# Patient Record
Sex: Female | Born: 1990 | Race: Black or African American | Hispanic: No | Marital: Married | Smoking: Never smoker
Health system: Southern US, Community
[De-identification: ages and names within clinical notes are randomized; demographics above are authoritative.]

## PROBLEM LIST (undated history)

## (undated) DIAGNOSIS — G43909 Migraine, unspecified, not intractable, without status migrainosus: Secondary | ICD-10-CM

## (undated) DIAGNOSIS — D649 Anemia, unspecified: Secondary | ICD-10-CM

## (undated) DIAGNOSIS — J45909 Unspecified asthma, uncomplicated: Secondary | ICD-10-CM

## (undated) DIAGNOSIS — R519 Headache, unspecified: Secondary | ICD-10-CM

## (undated) DIAGNOSIS — R51 Headache: Secondary | ICD-10-CM

## (undated) DIAGNOSIS — R55 Syncope and collapse: Secondary | ICD-10-CM

## (undated) DIAGNOSIS — R0600 Dyspnea, unspecified: Secondary | ICD-10-CM

## (undated) DIAGNOSIS — K8020 Calculus of gallbladder without cholecystitis without obstruction: Secondary | ICD-10-CM

## (undated) DIAGNOSIS — IMO0001 Reserved for inherently not codable concepts without codable children: Secondary | ICD-10-CM

## (undated) HISTORY — DX: Reserved for inherently not codable concepts without codable children: IMO0001

## (undated) HISTORY — DX: Syncope and collapse: R55

## (undated) HISTORY — DX: Headache: R51

## (undated) HISTORY — DX: Migraine, unspecified, not intractable, without status migrainosus: G43.909

## (undated) HISTORY — DX: Headache, unspecified: R51.9

---

## 2010-01-22 ENCOUNTER — Emergency Department (HOSPITAL_COMMUNITY): Admission: EM | Admit: 2010-01-22 | Discharge: 2010-01-22 | Payer: Self-pay | Admitting: Emergency Medicine

## 2011-03-17 LAB — POCT RAPID STREP A (OFFICE): Streptococcus, Group A Screen (Direct): NEGATIVE

## 2013-04-11 ENCOUNTER — Encounter (HOSPITAL_COMMUNITY): Payer: Self-pay | Admitting: Emergency Medicine

## 2013-04-11 ENCOUNTER — Emergency Department (INDEPENDENT_AMBULATORY_CARE_PROVIDER_SITE_OTHER)
Admission: EM | Admit: 2013-04-11 | Discharge: 2013-04-11 | Disposition: A | Discharge status: Home / Self Care | Payer: Medicaid Other | Source: Home / Self Care | Attending: Emergency Medicine | Admitting: Emergency Medicine

## 2013-04-11 DIAGNOSIS — J069 Acute upper respiratory infection, unspecified: Secondary | ICD-10-CM

## 2013-04-11 DIAGNOSIS — H8110 Benign paroxysmal vertigo, unspecified ear: Secondary | ICD-10-CM

## 2013-04-11 LAB — POCT I-STAT, CHEM 8
BUN: 10 mg/dL (ref 6–23)
Chloride: 104 mEq/L (ref 96–112)
Potassium: 3.6 mEq/L (ref 3.5–5.1)
Sodium: 142 mEq/L (ref 135–145)

## 2013-04-11 NOTE — ED Notes (Addendum)
Pt is here c/o dizziness onset 3/31 Sx have included: fevers, nauseas, weakness Dizziness gets worse w/acitivty/walking; when resting it's not as bad Denies: v/d, abd pain, head inj/trauma Reports being sick last week w/a cold  She is alert and oriented w/no signs of acute distress.

## 2013-04-11 NOTE — ED Provider Notes (Signed)
Medical screening examination/treatment/procedure(s) were performed by non-physician practitioner and as supervising physician I was immediately available for consultation/collaboration.  Leslee Home, M.D.  Reuben Likes, MD 04/11/13 [phone]

## 2013-04-11 NOTE — ED Provider Notes (Signed)
History     CSN: [id number]  Arrival date & time 04/11/13  1031   None     Chief Complaint  Patient presents with  . Dizziness    (Consider location/radiation/quality/duration/timing/severity/associated sxs/prior treatment) HPI Comments: Pt reports feeling aches and chills, congestion starting on 4/9.  Chills and aches gone in a day, congestion persists and now has slight cough.  Presents today because began feeling dizzy last night, worse with position changes.  Describes lightheadedness, and also vertigo.   Patient is a 22 y.o. female presenting with URI. The history is provided by the patient.  URI Presenting symptoms: congestion, cough and sore throat   Presenting symptoms: no facial pain and no fever   Severity:  Moderate Onset quality:  Gradual Duration:  5 days Timing:  Constant Progression:  Unchanged Chronicity:  New Relieved by:  None tried Worsened by:  Nothing tried Ineffective treatments:  None tried Associated symptoms: myalgias   Associated symptoms: no sinus pain and no wheezing   Associated symptoms comment:  Dizziness   History reviewed. No pertinent past medical history.  History reviewed. No pertinent past surgical history.  History reviewed. No pertinent family history.  History  Substance Use Topics  . Smoking status: Never Smoker   . Smokeless tobacco: Not on file  . Alcohol Use: No    OB History   Grav Para Term Preterm Abortions TAB SAB Ect Mult Living                  Review of Systems  Constitutional: Positive for chills. Negative for fever.  HENT: Positive for congestion, sore throat and postnasal drip. Negative for sinus pressure.        Ear pressure  Respiratory: Positive for cough. Negative for chest tightness, shortness of breath and wheezing.   Musculoskeletal: Positive for myalgias.  Neurological: Positive for dizziness and light-headedness.    Allergies  Review of patient's allergies indicates no known  allergies.  Home Medications  No current outpatient prescriptions on file.  BP 116/65  Pulse 103  Temp(Src) 98.3 F (36.8 C) (Oral)  Resp 18  SpO2 100%  LMP 04/06/2013  Physical Exam  Constitutional: She is oriented to person, place, and time. She appears well-developed and well-nourished. No distress.  HENT:  Right Ear: Tympanic membrane, external ear and ear canal normal.  Left Ear: Tympanic membrane, external ear and ear canal normal.  Nose: Mucosal edema present. Right sinus exhibits no maxillary sinus tenderness and no frontal sinus tenderness. Left sinus exhibits no maxillary sinus tenderness and no frontal sinus tenderness.  Mouth/Throat: Oropharynx is clear and moist and mucous membranes are normal.  dix-hallpike indicates problem with L side.    Eyes: Conjunctivae and EOM are normal.  Nystagmus with dix hallpike on Left.   Cardiovascular: Normal rate and regular rhythm.   Pulmonary/Chest: Effort normal and breath sounds normal.  Neurological: She is alert and oriented to person, place, and time. Gait normal.    ED Course  Procedures (including critical care time)  Labs Reviewed  POCT I-STAT, CHEM 8 - Abnormal; Notable for the following:    Glucose, Bld 101 (*)    Calcium, Ion 1.24 (*)    All other components within normal limits   No results found.   1. Benign positional vertigo   2. URI (upper respiratory infection)       MDM  Epley maneuver resolved dizziness/vertigo sx.  Pt not orthostatic, not anemic.  Like benign vertigo and uri.  Pt  to try otc decongestant and/or saline spray. Can repeat epley maneuver at home if dizzy sx return.         Cathlyn Parsons, NP 04/11/13 1228  Cathlyn Parsons, NP 04/11/13 1230

## 2014-07-03 ENCOUNTER — Encounter (HOSPITAL_COMMUNITY): Payer: Self-pay | Admitting: Emergency Medicine

## 2014-07-03 ENCOUNTER — Emergency Department (HOSPITAL_COMMUNITY)
Admission: EM | Admit: 2014-07-03 | Discharge: 2014-07-03 | Disposition: A | Discharge status: Home / Self Care | Payer: Medicaid Other | Source: Home / Self Care | Attending: Emergency Medicine | Admitting: Emergency Medicine

## 2014-07-03 DIAGNOSIS — L509 Urticaria, unspecified: Secondary | ICD-10-CM

## 2014-07-03 HISTORY — DX: Unspecified asthma, uncomplicated: J45.909

## 2014-07-03 HISTORY — DX: Anemia, unspecified: D64.9

## 2014-07-03 MED ORDER — PREDNISONE 20 MG PO TABS: ORAL_TABLET | ORAL | Status: DC

## 2014-07-03 MED ORDER — METHYLPREDNISOLONE ACETATE 80 MG/ML IJ SUSP
INTRAMUSCULAR | Status: AC
  Filled 2014-07-03: qty 1

## 2014-07-03 MED ORDER — METHYLPREDNISOLONE ACETATE 80 MG/ML IJ SUSP
80.0000 mg | Freq: Once | INTRAMUSCULAR | Status: AC
  Administered 2014-07-03: 80 mg via INTRAMUSCULAR

## 2014-07-03 MED ORDER — DIPHENHYDRAMINE HCL 50 MG/ML IJ SOLN
25.0000 mg | Freq: Once | INTRAMUSCULAR | Status: AC
  Administered 2014-07-03: 25 mg via INTRAMUSCULAR

## 2014-07-03 MED ORDER — RANITIDINE HCL 150 MG PO TABS: 150.0000 mg | ORAL_TABLET | Freq: Two times a day (BID) | ORAL | Status: DC

## 2014-07-03 MED ORDER — HYDROXYZINE HCL 25 MG PO TABS
25.0000 mg | ORAL_TABLET | Freq: Three times a day (TID) | ORAL | Status: DC
Start: 2014-07-03 — End: 2021-08-20

## 2014-07-03 MED ORDER — DIPHENHYDRAMINE HCL 50 MG/ML IJ SOLN
INTRAMUSCULAR | Status: AC
  Filled 2014-07-03: qty 1

## 2014-07-03 NOTE — ED Notes (Signed)
C/o  Hives that come and go with irritation over the past three weeks.  Denies changes in soaps/detergents and no new medications.

## 2014-07-03 NOTE — Discharge Instructions (Signed)
Hives Hives are itchy, red, swollen areas of the skin. They can vary in size and location on your body. Hives can come and go for hours or several days (acute hives) or for several weeks (chronic hives). Hives do not spread from person to person (noncontagious). They may get worse with scratching, exercise, and emotional stress. CAUSES   Allergic reaction to food, additives, or drugs.  Infections, including the common cold.  Illness, such as vasculitis, lupus, or thyroid disease.  Exposure to sunlight, heat, or cold.  Exercise.  Stress.  Contact with chemicals. SYMPTOMS   Red or white swollen patches on the skin. The patches may change size, shape, and location quickly and repeatedly.  Itching.  Swelling of the hands, feet, and face. This may occur if hives develop deeper in the skin. DIAGNOSIS  Your caregiver can usually tell what is wrong by performing a physical exam. Skin or blood tests may also be done to determine the cause of your hives. In some cases, the cause cannot be determined. TREATMENT  Mild cases usually get better with medicines such as antihistamines. Severe cases may require an emergency epinephrine injection. If the cause of your hives is known, treatment includes avoiding that trigger.  HOME CARE INSTRUCTIONS   Avoid causes that trigger your hives.  Take antihistamines as directed by your caregiver to reduce the severity of your hives. Non-sedating or low-sedating antihistamines are usually recommended. Do not drive while taking an antihistamine.  Take any other medicines prescribed for itching as directed by your caregiver.  Wear loose-fitting clothing.  Keep all follow-up appointments as directed by your caregiver. SEEK MEDICAL CARE IF:   You have persistent or severe itching that is not relieved with medicine.  You have painful or swollen joints. SEEK IMMEDIATE MEDICAL CARE IF:   You have a fever.  Your tongue or lips are swollen.  You have  trouble breathing or swallowing.  You feel tightness in the throat or chest.  You have abdominal pain. These problems may be the first sign of a life-threatening allergic reaction. Call your local emergency services (911 in [place] MAKE SURE YOU:   Understand these instructions.  Will watch your condition.  Will get help right away if you are not doing well or get worse. Document Released: 12/15/2005 Document Revised: 12/20/2013 Document Reviewed: 03/09/2012 Exit[employer] Patient Information 2015 [employer]. This information is not intended to replace advice given to you by your health care provider. Make sure you discuss any questions you have with your health care provider.  

## 2014-07-03 NOTE — ED Provider Notes (Signed)
  Chief Complaint    Chief Complaint  Patient presents with  . Urticaria    History of Present Illness      Denyce RobertShari Hatfield is a 23 year old female who has had intermittent, migratory urticarial rash since June 15. She cannot think of anything that might have precipitated this. Particularly she denies any bites, stings, medications, new foods, or exposure to any contact allergens or antigens. The hives come and go on different parts of her body. They're very itchy. She's had some swelling of her lips. She denies any swelling of the tongue or throat. She's had no difficulty breathing. She has not tried anything for these except for some topical creams. She denies being sick in any way including fever, chills, URI symptoms, sore throat, adenopathy, or cough.  Review of Systems   Other than as noted above, the patient denies any of the following symptoms: Systemic:  No fever or chills. ENT:  No nasal congestion, rhinorrhea, sore throat, swelling of lips, tongue or throat. Resp:  No cough, wheezing, or shortness of breath.  PMFSH    Past medical history, family history, social history, meds, and allergies were reviewed. Her only medication is birth control pills.  Physical Exam     Vital signs:  BP 119/81  Pulse 87  Temp(Src) 98.2 F (36.8 C) (Oral)  Resp 16  SpO2 100%  LMP 06/14/2014 Gen:  Alert, oriented, in no distress. ENT:  Pharynx clear, no intraoral lesions, moist mucous membranes. Lungs:  Clear to auscultation. Skin:  She has raised, urticarial lesions on both upper arms. Skin was otherwise clear.  Course in [facility]     The following meds were given:  Medications  methylPREDNISolone acetate (DEPO-MEDROL) injection 80 mg (80 mg Intramuscular Given 07/03/14 1419)  diphenhydrAMINE (BENADRYL) injection 25 mg (25 mg Intramuscular Given 07/03/14 1419)   Assessment    The encounter diagnosis was Urticaria.  Plan     1.  Meds:  The following meds were prescribed:    Discharge Medication List as of 07/03/2014  1:57 PM    START taking these medications   Details  hydrOXYzine (ATARAX/VISTARIL) 25 MG tablet Take 1 tablet (25 mg total) by mouth 3 (three) times daily., Starting 07/03/2014, Until Discontinued, Normal    predniSONE (DELTASONE) 20 MG tablet Take 3 daily for 5 days, 2 daily for 5 days, 1 daily for 5 days., Normal    ranitidine (ZANTAC) 150 MG tablet Take 1 tablet (150 mg total) by mouth 2 (two) times daily., Starting 07/03/2014, Until Discontinued, Normal        2.  Patient Education/Counseling:  The patient was given appropriate handouts, self care instructions, and instructed in symptomatic relief.  Suggested she followup with dermatology.  3.  Follow up:  The patient was told to follow up here if no better in 3 to 4 days, or sooner if becoming worse in any way, and given some red flag symptoms such as worsening rash, fever, or difficulty breathing which would prompt immediate return.  Follow up here if necessary.      Reuben Likesavid C Mikita Lesmeister, MD 07/03/14 [phone]

## 2014-10-30 ENCOUNTER — Encounter: Payer: Self-pay | Admitting: Medical

## 2014-10-30 ENCOUNTER — Ambulatory Visit (INDEPENDENT_AMBULATORY_CARE_PROVIDER_SITE_OTHER): Payer: BC Managed Care – PPO | Admitting: Medical

## 2014-10-30 VITALS — BP 124/85 | HR 112 | Temp 99.8°F | Ht 66.0 in | Wt 194.8 lb

## 2014-10-30 DIAGNOSIS — IMO0001 Reserved for inherently not codable concepts without codable children: Secondary | ICD-10-CM

## 2014-10-30 DIAGNOSIS — R52 Pain, unspecified: Secondary | ICD-10-CM

## 2014-10-30 DIAGNOSIS — M609 Myositis, unspecified: Secondary | ICD-10-CM

## 2014-10-30 DIAGNOSIS — J209 Acute bronchitis, unspecified: Secondary | ICD-10-CM | POA: Insufficient documentation

## 2014-10-30 DIAGNOSIS — M791 Myalgia: Secondary | ICD-10-CM

## 2014-10-30 DIAGNOSIS — J208 Acute bronchitis due to other specified organisms: Secondary | ICD-10-CM

## 2014-10-30 DIAGNOSIS — J45901 Unspecified asthma with (acute) exacerbation: Secondary | ICD-10-CM

## 2014-10-30 HISTORY — DX: Reserved for inherently not codable concepts without codable children: IMO0001

## 2014-10-30 LAB — POCT INFLUENZA A/B
Influenza A, POC: NEGATIVE
Influenza B, POC: NEGATIVE

## 2014-10-30 MED ORDER — BENZONATATE 100 MG PO CAPS: 100.0000 mg | ORAL_CAPSULE | Freq: Three times a day (TID) | ORAL | Status: DC | PRN

## 2014-10-30 MED ORDER — AZITHROMYCIN 250 MG PO TABS: ORAL_TABLET | ORAL | Status: DC

## 2014-10-30 MED ORDER — BECLOMETHASONE DIPROPIONATE 40 MCG/ACT IN AERS: 2.0000 | INHALATION_SPRAY | Freq: Two times a day (BID) | RESPIRATORY_TRACT | Status: DC

## 2014-10-30 MED ORDER — ALBUTEROL SULFATE HFA 108 (90 BASE) MCG/ACT IN AERS: 2.0000 | INHALATION_SPRAY | Freq: Four times a day (QID) | RESPIRATORY_TRACT | Status: DC | PRN

## 2014-10-30 NOTE — Assessment & Plan Note (Signed)
Rx azithromycin. Some wheezing as well. See asthma problem based not.

## 2014-10-30 NOTE — Assessment & Plan Note (Signed)
Rapid flu negative. Pt had this along with other symptoms so did test.

## 2014-10-30 NOTE — Progress Notes (Signed)
Subjective:    Patient ID: Karen Hatfield, female    DOB: 10/14/1991, 23 y.o.   MRN: [id number]  HPI   First time hx. PMH reviewed.  Pt has history of anemia. Pt states no blood test for greater than 1 yr. Pt states was told only a little low. No fatigue.  Pt last week had some asthma excacerbation. Pt states got worse on field trip with kids . She was dancing with kids and developed dry cough and had some wheezing.  Last week on Wednesday coughing episode when dancing. Then this weekend some nasal congestion with some chest congestion. Minimal faint chest discomfort this am when she was breathing deep. Some sensation as if can't take full deep breath.  Pt states in past no asthma excacerbtion recenlty. Usually in past only during exercise in middle school. None since.  Some mild achiness shoulder.  Some producive cough today.  LMP- October 13th.  Past Medical History  Diagnosis Date  . Asthma   . Anemia     History   Social History  . Marital Status: Single    Spouse Name: N/A    Number of Children: N/A  . Years of Education: N/A   Occupational History  . Not on file.   Social History Main Topics  . Smoking status: Never Smoker   . Smokeless tobacco: Not on file  . Alcohol Use: Yes  . Drug Use: Not on file  . Sexual Activity: Yes    Birth Control/ Protection: Pill   Other Topics Concern  . Not on file   Social History Narrative    No past surgical history on file.  Family History  Problem Relation Age of Onset  . Cancer Mother     No Known Allergies  Current Outpatient Prescriptions on File Prior to Visit  Medication Sig Dispense Refill  . desogestrel-ethinyl estradiol (VELIVET,CAZIANT,CESIA,CYCLESSA) 0.1/0.125/0.15 -0.025 MG tablet Take 1 tablet by mouth daily.    . hydrOXYzine (ATARAX/VISTARIL) 25 MG tablet Take 1 tablet (25 mg total) by mouth 3 (three) times daily. 45 tablet 1  . predniSONE (DELTASONE) 20 MG tablet Take 3 daily for 5 days, 2  daily for 5 days, 1 daily for 5 days. 30 tablet 0  . ranitidine (ZANTAC) 150 MG tablet Take 1 tablet (150 mg total) by mouth 2 (two) times daily. 30 tablet 1   No current facility-administered medications on file prior to visit.    BP 124/85 mmHg  Pulse 112  Temp(Src) 99.8 F (37.7 C) (Oral)  Ht 5\' 6"  (1.676 m)  Wt 194 lb 12.8 oz (88.361 kg)  BMI 31.46 kg/m2  SpO2 98%  LMP 10/14/2014      Review of Systems  Constitutional: Negative for fever, chills and fatigue.  HENT: Positive for congestion. Negative for ear discharge, ear pain, nosebleeds, postnasal drip, sinus pressure, sneezing, sore throat and trouble swallowing.   Respiratory: Positive for cough and wheezing. Negative for choking, chest tightness and shortness of breath.        Mild productive cough  Cardiovascular: Negative for chest pain and palpitations.  Gastrointestinal: Negative.   Genitourinary: Negative.   Musculoskeletal: Negative.        Mild transient left costonchondral junction  Region pain. Last seconds on cough and deep inspiration  Skin: Negative.   Neurological: Negative.        Objective:   Physical Exam  .General  Mental Status - Alert. General Appearance - Well groomed. Not in acute distress.  Skin  Rashes- No Rashes.  HEENT Head- Normal. Ear Auditory Canal - Left- Normal. Right - Normal.Tympanic Membrane- Left- Normal. Right- Normal. Eye Sclera/Conjunctiva- Left- Normal. Right- Normal. Nose & Sinuses Nasal Mucosa- Left-  Boggy + Congested. Right-  Boggy + Congested. Mouth & Throat Lips: Upper Lip- Normal: no dryness, cracking, pallor, cyanosis, or vesicular eruption. Lower Lip-Normal: no dryness, cracking, pallor, cyanosis or vesicular eruption. Buccal Mucosa- Bilateral- No Aphthous ulcers. Oropharynx- No Discharge or Erythema. Tonsils: Characteristics- Bilateral- No Erythema or Congestion. Size/Enlargement- Bilateral- No enlargement. Discharge- bilateral-None.  Neck Neck- Supple.  No Masses.   Chest and Lung Exam Auscultation: Breath Sounds:-Normal. CTA.  Cardiovascular Auscultation:Rythm- Regular, rate and rhythm. Murmurs & Other Heart Sounds:Ausculatation of the heart reveal- No Murmurs.  Lymphatic Head & Neck General Head & Neck Lymphatics: Bilateral: Description- No Localized lymphadenopathy.  Anterior thorax- faint mild costochondral junctio pain on palpation and deep inspiration.        Assessment & Plan:

## 2014-10-30 NOTE — Patient Instructions (Addendum)
It  appears you  have likely mild asthma exacerbation with some bronchitis.  I will presrcibe qvar  and albuterol inhaler for any shortness of breath or wheezing. For cough I am writing benzonatate. Also, I am prescribing azithromycin for the probable infection.  Since you have some mild body aches we did a flutest which was negative.  For you transient chest wall pain on coughing and deep breathing this is likely cartilage inflammation. For this you can take ibuprofen otc.  Follow up in 7 days or as needed.

## 2014-10-30 NOTE — Progress Notes (Signed)
Pre visit review using our clinic review tool, if applicable. No additional management support is needed unless otherwise documented below in the visit note. 

## 2014-10-30 NOTE — Assessment & Plan Note (Signed)
Years since any flare. Some recently when exposed to cold weather and when she was dancing/mild exercising. Rx qvar and albuterol today.

## 2014-10-31 ENCOUNTER — Encounter: Payer: Self-pay | Admitting: Medical

## 2014-10-31 ENCOUNTER — Telehealth: Payer: Self-pay | Admitting: Medical

## 2014-10-31 NOTE — Telephone Encounter (Signed)
Will get LPN Clydie BraunKaren to advise pt that she is on antibiotic azithromycin. That antibiotic is good for ear and throat infections. She did not have infections in those areas at time I saw her but sometimes infections develop later. She was treated for bronchitis. If her throat pain is increasing or ear pain increases despite azithromycin notify us may need re-eval. Currently advise ibuprofen otc 600-800 mg tid and continue antibiotic.

## 2014-10-31 NOTE — Telephone Encounter (Signed)
Caller name: Denyce RobertBenjamin, Romanita Relation to pt: self  Call back number: [phone]   Reason for call:   Pt was seen 10/30/14 pt states she no longer has a fever and her body no longer aches but she expericing soar throat and pain in her ear

## 2014-11-01 NOTE — Telephone Encounter (Signed)
Called patient regarding ABT she was placed om. States she started on yesterday. Advised this should help throat and ear pain. Advised to use OTC Ibuprofen 600-800 mg per ES. States she is starting to feel better since taking first dose of medication.

## 2014-11-08 ENCOUNTER — Ambulatory Visit (INDEPENDENT_AMBULATORY_CARE_PROVIDER_SITE_OTHER): Payer: BC Managed Care – PPO | Admitting: Medical

## 2014-11-08 ENCOUNTER — Encounter: Payer: Self-pay | Admitting: Medical

## 2014-11-08 VITALS — BP 121/83 | HR 80 | Temp 98.5°F | Ht 66.0 in | Wt 194.0 lb

## 2014-11-08 DIAGNOSIS — J45901 Unspecified asthma with (acute) exacerbation: Secondary | ICD-10-CM

## 2014-11-08 DIAGNOSIS — Z23 Encounter for immunization: Secondary | ICD-10-CM

## 2014-11-08 MED ORDER — BECLOMETHASONE DIPROPIONATE 40 MCG/ACT IN AERS: 2.0000 | INHALATION_SPRAY | Freq: Two times a day (BID) | RESPIRATORY_TRACT | Status: DC

## 2014-11-08 MED ORDER — ALBUTEROL SULFATE HFA 108 (90 BASE) MCG/ACT IN AERS: 2.0000 | INHALATION_SPRAY | Freq: Four times a day (QID) | RESPIRATORY_TRACT | Status: AC | PRN

## 2014-11-08 MED ORDER — DESOGEST-ETH ESTRAD TRIPHASIC 0.1/0.125/0.15 -0.025 MG PO TABS: ORAL_TABLET | ORAL | Status: DC

## 2014-11-08 NOTE — Patient Instructions (Addendum)
I ask that you get your copies of vaccines done in [place] and be seen at [facility]. Since [place]  You may need typhoid vaccine and Japanese encephaphalitis vaccine.  Also they could fill in gaps of prior vaccine or get titers.  I am making inhalers available for you in event of asthma flare while in [place].  Follow up here as needed  Flu vaccine given today.  I will let them fill out form. If they prefer that we fill out form then please get records/copies of vaccines they gave, titers etc.

## 2014-11-08 NOTE — Progress Notes (Signed)
Pre visit review using our clinic review tool, if applicable. No additional management support is needed unless otherwise documented below in the visit note. 

## 2014-11-08 NOTE — Assessment & Plan Note (Signed)
I gave refill of her qvar and albuterol so she will have available for international travel.

## 2014-11-08 NOTE — Progress Notes (Signed)
   Subjective:    Patient ID: Karen Hatfield, female    DOB: Dec 18, 1991, 22 y.o.   MRN: [id number]  HPI   Pt in to fill out school form. Pt planning to go to [place] on a college trip. 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 Pt states she this past march she had a tuberculin skin test. Pt had all of her vaccines in [place]. Pt does not those records. For [place] hepatitis A recommended, Typhoid vaccine is recommended.   Also MMR, DTP,  Varicella vaccine(but pt atualy got varicella), polio and fluvaccine recommended to be up to date.  Hep B vaccine only recommended for risky behavior.  Pt will be in [place]  for 4 months. Per CDC she may need vaccine against Japanese Encepahlitis. If in [place] greater than a month.   Pt prior bronchitis with astha flare is resolved now. Responded to treatment.  Pt trip is in February.    Review of Systems  Constitutional: Negative.   HENT: Negative.   Respiratory: Negative.   Cardiovascular: Negative.        Objective:   Physical Exam   General  Mental Status - Alert. General Appearance - Well groomed. Not in acute distress.  Skin Rashes- No Rashes.  HEENT Head- Normal. Ear Auditory Canal - Left- Normal. Right - Normal.Tympanic Membrane- Left- Normal. Right- Normal. Eye Sclera/Conjunctiva- Left- Normal. Right- Normal. Nose & Sinuses Nasal Mucosa- Left- Not Boggy or Congested. Right- Not Boggy or Congested. Mouth & Throat Lips: Upper Lip- Normal: no dryness, cracking, pallor, cyanosis, or vesicular eruption. Lower Lip-Normal: no dryness, cracking, pallor, cyanosis or vesicular eruption. Buccal Mucosa- Bilateral- No Aphthous ulcers. Oropharynx- No Discharge or Erythema. Tonsils: Characteristics- Bilateral- No Erythema or Congestion. Size/Enlargement- Bilateral- No enlargement. Discharge- bilateral-None.  Neck Neck- Supple. No Masses.   Chest and Lung Exam Auscultation: Breath  Sounds:-Normal.  Cardiovascular Auscultation:Rythm- Regular.  Murmurs & Other Heart Sounds:Ausculatation of the heart reveal- No Murmurs.  Lymphatic Head & Neck General Head & Neck Lymphatics: Bilateral: Description- No Localized lymphadenopathy.         Assessment & Plan:

## 2014-11-09 ENCOUNTER — Telehealth: Payer: Self-pay | Admitting: Medical

## 2014-11-09 NOTE — Telephone Encounter (Signed)
Referred to provider ES.

## 2014-11-09 NOTE — Telephone Encounter (Signed)
Pt did call the traveler clinic and Japanese encephalitis vaccine cast $280 dollars. She will get that later. Pt has not gotten her old records of vaccines to us yet. They were from [place]. Not in  data base per lpn. So will review those when she faxes to my attention and then  see if she may need titers or vaccine series. She will need another ppd if last one not in 6 months. So at this point I am awaiting fax of her old vaccine records and will proceed from there. Pt trip is in February so we have some time to work on this.

## 2014-11-09 NOTE — Telephone Encounter (Signed)
Caller name: Melvenia BeamShari Relation to pt: self Call back number: [phone] Pharmacy:  Reason for call:   Patient states that immunization report should be sent to us this evening or tomorrow. Patient wants to pick up forms.

## 2014-11-10 ENCOUNTER — Telehealth: Payer: Self-pay | Admitting: Medical

## 2014-11-10 NOTE — Telephone Encounter (Signed)
I called pt today informing her(left message) that we could put her on nurse schedule today but I need her to confirm that she can come in. Since she is pretty sure she had a ppd  done in march of this last year. She did not have that documented when she was in the office. She needs ppd within 6 months. So I explained on the message she could come in today just call and confirm that she can come in. Then we could read ppd on Monday. I could fill out the form on Monday. She could work with Travel clinic before February to get [place] vaccine and I could review her vaccine records later when they are in.

## 2014-11-13 ENCOUNTER — Telehealth: Payer: Self-pay | Admitting: *Deleted

## 2014-11-13 ENCOUNTER — Ambulatory Visit (INDEPENDENT_AMBULATORY_CARE_PROVIDER_SITE_OTHER): Payer: BC Managed Care – PPO

## 2014-11-13 DIAGNOSIS — Z111 Encounter for screening for respiratory tuberculosis: Secondary | ICD-10-CM

## 2014-11-13 NOTE — Telephone Encounter (Signed)
Error

## 2014-11-13 NOTE — Progress Notes (Signed)
Pt tolerated injection well

## 2014-11-13 NOTE — Telephone Encounter (Signed)
Immunization record received and forwarded to American FinancialEdward Saguier PA-C. JG//CMA

## 2014-11-13 NOTE — Progress Notes (Signed)
Pre visit review using our clinic review tool, if applicable. No additional management support is needed unless otherwise documented below in the visit note. 

## 2014-11-15 ENCOUNTER — Ambulatory Visit (HOSPITAL_BASED_OUTPATIENT_CLINIC_OR_DEPARTMENT_OTHER)
Admission: RE | Admit: 2014-11-15 | Discharge: 2014-11-15 | Disposition: A | Discharge status: Home / Self Care | Payer: BC Managed Care – PPO | Source: Ambulatory Visit | Attending: Medical | Admitting: Medical

## 2014-11-15 ENCOUNTER — Telehealth: Payer: Self-pay | Admitting: Medical

## 2014-11-15 DIAGNOSIS — R7611 Nonspecific reaction to tuberculin skin test without active tuberculosis: Secondary | ICD-10-CM

## 2014-11-15 LAB — TB SKIN TEST
Induration: 20 mm
TB Skin Test: POSITIVE

## 2014-11-15 NOTE — Telephone Encounter (Signed)
20 mm +ppd. Will get cxr today. Hold her form.

## 2014-11-16 ENCOUNTER — Telehealth: Payer: Self-pay | Admitting: Medical

## 2014-11-16 NOTE — Telephone Encounter (Signed)
I did talk with Awilda Metroammy Faucette at [facility]. She advised sending over demographic information, ppd results and cxr result. Then  Tammy will call patient and try to get pt in for an interview. Amado CoeJessica Glover in our office is working on faxing the paperwork.

## 2014-11-16 NOTE — Telephone Encounter (Signed)
I did call health department to find out with her ppd at 20 mm, no fever, no chills, no cough, no night sweats, no weight loss or known tb contact and neg cxr does she need isoniazid(prevention). Left Message with Tammy at tb clinic. And will await return call. I did call the patient and let her know negative cxr and waiting on call back from [facility].

## 2014-11-16 NOTE — Telephone Encounter (Signed)
Notified pt that she should be getting a call from the health dept.

## 2014-11-27 NOTE — Telephone Encounter (Signed)
Patient wanted Ramon Dredgedward to know that she has an appointment at the [facility] department on 12/2 for TB info and 12/15 immunizations

## 2014-11-28 NOTE — Telephone Encounter (Signed)
Health and medical clearance form emailed to [email or web address]. JG//CMA

## 2015-10-21 ENCOUNTER — Other Ambulatory Visit: Payer: Self-pay | Admitting: Obstetrics & Gynecology

## 2015-11-16 ENCOUNTER — Other Ambulatory Visit: Payer: Self-pay | Admitting: Obstetrics & Gynecology

## 2017-12-29 HISTORY — PX: WISDOM TOOTH EXTRACTION: SHX21

## 2020-12-29 NOTE — L&D Delivery Note (Signed)
Delivery Note At 12:27 AM a viable female was delivered via Vaginal, Spontaneous (Presentation: Left Occiput Anterior).  APGAR: 9, 9; weight pending.   Placenta status: Spontaneous, Intact.  Cord: 3 vessels with the following complications: None.  Cord pH: n/a  Anesthesia: Epidural Episiotomy: None Lacerations:  vaginal Suture Repair: 3.0 vicryl rapide Est. Blood Loss (mL):  300  Mom to postpartum.  Baby to Couplet care / Skin to Skin.  Mitchel Honour 09/11/2021, 12:43 AM

## 2021-05-16 ENCOUNTER — Inpatient Hospital Stay (HOSPITAL_COMMUNITY)
Admission: AD | Admit: 2021-05-16 | Discharge: 2021-05-16 | Disposition: A | Discharge status: Home / Self Care | Payer: BLUE CROSS/BLUE SHIELD | Attending: Obstetrics and Gynecology | Admitting: Obstetrics and Gynecology

## 2021-05-16 ENCOUNTER — Encounter (HOSPITAL_COMMUNITY): Payer: Self-pay | Admitting: Obstetrics and Gynecology

## 2021-05-16 DIAGNOSIS — O26892 Other specified pregnancy related conditions, second trimester: Secondary | ICD-10-CM | POA: Diagnosis present

## 2021-05-16 DIAGNOSIS — R109 Unspecified abdominal pain: Secondary | ICD-10-CM

## 2021-05-16 DIAGNOSIS — Z3A21 21 weeks gestation of pregnancy: Secondary | ICD-10-CM | POA: Diagnosis not present

## 2021-05-16 DIAGNOSIS — R1010 Upper abdominal pain, unspecified: Secondary | ICD-10-CM | POA: Insufficient documentation

## 2021-05-16 LAB — URINALYSIS, ROUTINE W REFLEX MICROSCOPIC
Bacteria, UA: NONE SEEN
Bilirubin Urine: NEGATIVE
Glucose, UA: NEGATIVE mg/dL
Hgb urine dipstick: NEGATIVE
Ketones, ur: NEGATIVE mg/dL
Leukocytes,Ua: NEGATIVE
Nitrite: NEGATIVE
Protein, ur: NEGATIVE mg/dL
Specific Gravity, Urine: 1.017 (ref 1.005–1.030)
pH: 5 (ref 5.0–8.0)

## 2021-05-16 LAB — CBC
HCT: 34.8 % — ABNORMAL LOW (ref 36.0–46.0)
Hemoglobin: 11 g/dL — ABNORMAL LOW (ref 12.0–15.0)
MCH: 27.5 pg (ref 26.0–34.0)
MCHC: 31.6 g/dL (ref 30.0–36.0)
MCV: 87 fL (ref 80.0–100.0)
Platelets: 297 10*3/uL (ref 150–400)
RBC: 4 MIL/uL (ref 3.87–5.11)
RDW: 14.4 % (ref 11.5–15.5)
WBC: 10.7 10*3/uL — ABNORMAL HIGH (ref 4.0–10.5)
nRBC: 0 % (ref 0.0–0.2)

## 2021-05-16 LAB — COMPREHENSIVE METABOLIC PANEL
ALT: 14 U/L (ref 0–44)
AST: 18 U/L (ref 15–41)
Albumin: 3.4 g/dL — ABNORMAL LOW (ref 3.5–5.0)
Alkaline Phosphatase: 89 U/L (ref 38–126)
Anion gap: 7 (ref 5–15)
BUN: <5 mg/dL — ABNORMAL LOW (ref 6–20)
CO2: 23 mmol/L (ref 22–32)
Calcium: 9.2 mg/dL (ref 8.9–10.3)
Chloride: 105 mmol/L (ref 98–111)
Creatinine, Ser: 0.82 mg/dL (ref 0.44–1.00)
GFR, Estimated: >60 mL/min (ref >60)
Glucose, Bld: 95 mg/dL (ref 70–99)
Potassium: 3.4 mmol/L — ABNORMAL LOW (ref 3.5–5.1)
Sodium: 135 mmol/L (ref 135–145)
Total Bilirubin: 0.5 mg/dL (ref 0.3–1.2)
Total Protein: 7 g/dL (ref 6.5–8.1)

## 2021-05-16 LAB — POCT PREGNANCY, URINE: Preg Test, Ur: POSITIVE — AB

## 2021-05-16 LAB — ABO/RH: ABO/RH(D): A POS

## 2021-05-16 LAB — LIPASE, BLOOD: Lipase: 23 U/L (ref 11–51)

## 2021-05-16 MED ORDER — FAMOTIDINE 20 MG PO TABS: 20.0000 mg | ORAL_TABLET | Freq: Two times a day (BID) | ORAL | 1 refills | Status: DC

## 2021-05-16 NOTE — MAU Note (Signed)
Pt states she had BM this evening, and after felt intense upper abd pain, 10/10, lasting about 15-30 minutes after she got in the shower, pain has not returned. Pt reports normal BM every other day. Pt called yesterday to establish care with [facility], first appt 6/6. No prenatal care, just moved back here.

## 2021-05-16 NOTE — MAU Provider Note (Signed)
Chief Complaint: Abdominal Pain   Event Date/Time   First Provider Initiated Contact with Patient 05/16/21 0329      SUBJECTIVE HPI: Karen Hatfield is a 30 y.o. G1P0 at [redacted]w[redacted]d by LMP who presents to maternity admissions reporting onset of severe upper abdominal pain tonight about 2 hours after eating a grilled cheese sandwich, small slice of cheesecake, and some fruit.  The pain resolved before patient arrived in MAU but at home was severe and constant.  She did become nauseous and vomit x 1 with the pain. There is no cramping, contractions, or lower abdominal pain. There is no vaginal bleeding or leakage of fluid. She denies constipation or urinary symptoms. She is feeling fetal movement. She has not yet had prenatal care in the pregnancy beause she just moved back to [facility] but has an appointment with [facility] on 06/03/21.    Location: upper abdomen Quality: sharp, stabbing Severity: 10/10 on pain scale Duration: 1 hour Timing: constant Modifying factors: none Associated signs and symptoms: nausea and vomiting x1  HPI  Past Medical History:  Diagnosis Date  . Anemia   . Asthma   . Fainting episodes   . Frequent headaches   . Migraines   . Myalgia and myositis 10/30/2014   Past Surgical History:  Procedure Laterality Date  . WISDOM TOOTH EXTRACTION  2019   Social History   Socioeconomic History  . Marital status: Single    Spouse name: Not on file  . Number of children: Not on file  . Years of education: Not on file  . Highest education level: Not on file  Occupational History  . Not on file  Tobacco Use  . Smoking status: Never Smoker  . Smokeless tobacco: Never Used  Substance and Sexual Activity  . Alcohol use: Not Currently  . Drug use: Not Currently  . Sexual activity: Not Currently    Birth control/protection: None  Other Topics Concern  . Not on file  Social History Narrative  . Not on file   Social Determinants of Health   Financial  Resource Strain: Not on file  Food Insecurity: Not on file  Transportation Needs: Not on file  Physical Activity: Not on file  Stress: Not on file  Social Connections: Not on file  Intimate Partner Violence: Not on file   No current facility-administered medications on file prior to encounter.   Current Outpatient Medications on File Prior to Encounter  Medication Sig Dispense Refill  . albuterol (PROVENTIL HFA;VENTOLIN HFA) 108 (90 BASE) MCG/ACT inhaler Inhale 2 puffs into the lungs every 6 (six) hours as needed for wheezing or shortness of breath. 1 Inhaler 1  . beclomethasone (QVAR) 40 MCG/ACT inhaler Inhale 2 puffs into the lungs 2 (two) times daily at 10 AM and 5 PM. 1 Inhaler 1  . benzonatate (TESSALON) 100 MG capsule Take 1 capsule (100 mg total) by mouth 3 (three) times daily as needed for cough. 21 capsule 0  . hydrOXYzine (ATARAX/VISTARIL) 25 MG tablet Take 1 tablet (25 mg total) by mouth 3 (three) times daily. 45 tablet 1  . Multiple Vitamins-Minerals (AIRBORNE PO) Take by mouth.    . ranitidine (ZANTAC) 150 MG tablet Take 1 tablet (150 mg total) by mouth 2 (two) times daily. 30 tablet 1  . VELIVET 0.1/0.125/0.15 -0.025 MG tablet TAKE 1 TABLET EVERY DAY 28 tablet 0   No Known Allergies  ROS:  Review of Systems  Constitutional: Negative for chills and fever.  Respiratory: Negative for  cough and shortness of breath.   Cardiovascular: Negative for chest pain.  Gastrointestinal: Positive for abdominal pain, nausea and vomiting.  Genitourinary: Negative for dysuria, frequency and urgency.  Musculoskeletal: Negative.   Neurological: Negative for dizziness and headaches.     I have reviewed patient's Past Medical Hx, Surgical Hx, Family Hx, Social Hx, medications and allergies.   Physical Exam   Patient Vitals for the past 24 hrs:  BP Temp Temp src Pulse Resp SpO2 Height Weight  05/16/21 0207 (!) 110/59 98 F (36.7 C) Oral 90 18 100 % 5\' 4"  (1.626 m) 85.7 kg    Constitutional: Well-developed, well-nourished female in no acute distress.  Cardiovascular: normal rate Respiratory: normal effort GI: Abd soft, non-tender. Pos BS x 4 MS: Extremities nontender, no edema, normal ROM Neurologic: Alert and oriented x 4.  GU: Neg CVAT.  PELVIC EXAM: Cervix pink, visually closed, without lesion, scant white creamy discharge, vaginal walls and external genitalia normal Bimanual exam: Cervix 0/long/high, firm, anterior, neg CMT, uterus nontender, nonenlarged, adnexa without tenderness, enlargement, or mass  FHT 155 by doppler  LAB RESULTS Results for orders placed or performed during the hospital encounter of 05/16/21 (from the past 24 hour(s))  Urinalysis, Routine w reflex microscopic     Status: Abnormal   Collection Time: 05/16/21  2:10 AM  Result Value Ref Range   Color, Urine YELLOW YELLOW   APPearance HAZY (A) CLEAR   Specific Gravity, Urine 1.017 1.005 - 1.030   pH 5.0 5.0 - 8.0   Glucose, UA NEGATIVE NEGATIVE mg/dL   Hgb urine dipstick NEGATIVE NEGATIVE   Bilirubin Urine NEGATIVE NEGATIVE   Ketones, ur NEGATIVE NEGATIVE mg/dL   Protein, ur NEGATIVE NEGATIVE mg/dL   Nitrite NEGATIVE NEGATIVE   Leukocytes,Ua NEGATIVE NEGATIVE   RBC / HPF 0-5 0 - 5 RBC/hpf   WBC, UA 0-5 0 - 5 WBC/hpf   Bacteria, UA NONE SEEN NONE SEEN   Squamous Epithelial / LPF 0-5 0 - 5   Mucus PRESENT   Pregnancy, urine POC     Status: Abnormal   Collection Time: 05/16/21  2:18 AM  Result Value Ref Range   Preg Test, Ur POSITIVE (A) NEGATIVE       IMAGING No results found.  MAU Management/MDM: Orders Placed This Encounter  Procedures  . Urinalysis, Routine w reflex microscopic  . CBC  . Comprehensive metabolic panel  . Lipase, blood  . Pregnancy, urine POC  . ABO/Rh  . Discharge patient    Meds ordered this encounter  Medications  . famotidine (PEPCID) 20 MG tablet    Sig: Take 1 tablet (20 mg total) by mouth 2 (two) times daily.    Dispense:  60  tablet    Refill:  1    Order Specific Question:   Supervising Provider    Answer:   05/18/21 [[id number]]    Pt without acute abdomen, no emergent findings.  Pain resolved prior to arrival in MAU, but is consistent with GI pain, digestive or acid reflux related pain.    Rx for Pepcid.  Pt to f/u with [facility] as scheduled, return to MAU as needed for emergencies.   ASSESSMENT 1. Upper abdominal pain   2. Abdominal pain during pregnancy, second trimester   3. [redacted] weeks gestation of pregnancy     PLAN Discharge home Allergies as of 05/16/2021   No Known Allergies     Medication List    STOP taking these medications  benzonatate 100 MG capsule Commonly known as: TESSALON   ranitidine 150 MG tablet Commonly known as: ZANTAC   Velivet 0.1/0.125/0.15 -0.025 MG tablet Generic drug: desogestrel-ethinyl estradiol     TAKE these medications   AIRBORNE PO Take by mouth.   albuterol 108 (90 Base) MCG/ACT inhaler Commonly known as: VENTOLIN HFA Inhale 2 puffs into the lungs every 6 (six) hours as needed for wheezing or shortness of breath.   beclomethasone 40 MCG/ACT inhaler Commonly known as: Qvar Inhale 2 puffs into the lungs 2 (two) times daily at 10 AM and 5 PM.   famotidine 20 MG tablet Commonly known as: PEPCID Take 1 tablet (20 mg total) by mouth 2 (two) times daily.   hydrOXYzine 25 MG tablet Commonly known as: ATARAX/VISTARIL Take 1 tablet (25 mg total) by mouth 3 (three) times daily.       Follow-up Information    [place] Follow up.   Why: As scheduled Contact information: [address] [place] [address] [phone]        [facility] Follow up.   Specialty: Obstetrics and Gynecology Why: As needed for emergencies Contact information: [address] [id number] [place]he[place] [address] [phone]              Sharen Counter Certified  Nurse-Midwife 05/16/2021  3:58 AM

## 2021-06-14 LAB — OB RESULTS CONSOLE GC/CHLAMYDIA
Chlamydia: NEGATIVE
Gonorrhea: NEGATIVE

## 2021-06-14 LAB — OB RESULTS CONSOLE HEPATITIS B SURFACE ANTIGEN: Hepatitis B Surface Ag: NEGATIVE

## 2021-06-14 LAB — OB RESULTS CONSOLE HIV ANTIBODY (ROUTINE TESTING): HIV: NONREACTIVE

## 2021-08-20 ENCOUNTER — Other Ambulatory Visit: Payer: Self-pay

## 2021-08-20 ENCOUNTER — Inpatient Hospital Stay (HOSPITAL_COMMUNITY)
Admission: AD | Admit: 2021-08-20 | Discharge: 2021-08-20 | Disposition: A | Discharge status: Home / Self Care | Payer: BLUE CROSS/BLUE SHIELD | Attending: Obstetrics and Gynecology | Admitting: Obstetrics and Gynecology

## 2021-08-20 ENCOUNTER — Inpatient Hospital Stay (HOSPITAL_COMMUNITY): Payer: BLUE CROSS/BLUE SHIELD

## 2021-08-20 ENCOUNTER — Encounter (HOSPITAL_COMMUNITY): Payer: Self-pay | Admitting: Obstetrics and Gynecology

## 2021-08-20 DIAGNOSIS — K8062 Calculus of gallbladder and bile duct with acute cholecystitis without obstruction: Secondary | ICD-10-CM | POA: Diagnosis not present

## 2021-08-20 DIAGNOSIS — K819 Cholecystitis, unspecified: Secondary | ICD-10-CM | POA: Diagnosis present

## 2021-08-20 DIAGNOSIS — Z3A35 35 weeks gestation of pregnancy: Secondary | ICD-10-CM | POA: Insufficient documentation

## 2021-08-20 DIAGNOSIS — R1010 Upper abdominal pain, unspecified: Secondary | ICD-10-CM

## 2021-08-20 DIAGNOSIS — O99613 Diseases of the digestive system complicating pregnancy, third trimester: Secondary | ICD-10-CM | POA: Diagnosis not present

## 2021-08-20 DIAGNOSIS — O99013 Anemia complicating pregnancy, third trimester: Secondary | ICD-10-CM | POA: Diagnosis not present

## 2021-08-20 DIAGNOSIS — O26613 Liver and biliary tract disorders in pregnancy, third trimester: Secondary | ICD-10-CM | POA: Diagnosis not present

## 2021-08-20 DIAGNOSIS — Z7951 Long term (current) use of inhaled steroids: Secondary | ICD-10-CM | POA: Insufficient documentation

## 2021-08-20 DIAGNOSIS — O99513 Diseases of the respiratory system complicating pregnancy, third trimester: Secondary | ICD-10-CM | POA: Diagnosis not present

## 2021-08-20 DIAGNOSIS — D649 Anemia, unspecified: Secondary | ICD-10-CM

## 2021-08-20 DIAGNOSIS — J45909 Unspecified asthma, uncomplicated: Secondary | ICD-10-CM | POA: Insufficient documentation

## 2021-08-20 DIAGNOSIS — K8000 Calculus of gallbladder with acute cholecystitis without obstruction: Secondary | ICD-10-CM

## 2021-08-20 DIAGNOSIS — Z79899 Other long term (current) drug therapy: Secondary | ICD-10-CM | POA: Insufficient documentation

## 2021-08-20 DIAGNOSIS — R109 Unspecified abdominal pain: Secondary | ICD-10-CM | POA: Diagnosis present

## 2021-08-20 DIAGNOSIS — Z3689 Encounter for other specified antenatal screening: Secondary | ICD-10-CM

## 2021-08-20 LAB — CBC WITH DIFFERENTIAL/PLATELET
Abs Immature Granulocytes: 0.06 10*3/uL (ref 0.00–0.07)
Basophils Absolute: 0 10*3/uL (ref 0.0–0.1)
Basophils Relative: 0 %
Eosinophils Absolute: 0.1 10*3/uL (ref 0.0–0.5)
Eosinophils Relative: 1 %
HCT: 29.7 % — ABNORMAL LOW (ref 36.0–46.0)
Hemoglobin: 9.5 g/dL — ABNORMAL LOW (ref 12.0–15.0)
Immature Granulocytes: 1 %
Lymphocytes Relative: 18 %
Lymphs Abs: 1.8 10*3/uL (ref 0.7–4.0)
MCH: 26.5 pg (ref 26.0–34.0)
MCHC: 32 g/dL (ref 30.0–36.0)
MCV: 83 fL (ref 80.0–100.0)
Monocytes Absolute: 1 10*3/uL (ref 0.1–1.0)
Monocytes Relative: 10 %
Neutro Abs: 6.7 10*3/uL (ref 1.7–7.7)
Neutrophils Relative %: 70 %
Platelets: 227 10*3/uL (ref 150–400)
RBC: 3.58 MIL/uL — ABNORMAL LOW (ref 3.87–5.11)
RDW: 14.3 % (ref 11.5–15.5)
WBC: 9.6 10*3/uL (ref 4.0–10.5)
nRBC: 0 % (ref 0.0–0.2)

## 2021-08-20 LAB — COMPREHENSIVE METABOLIC PANEL
ALT: 20 U/L (ref 0–44)
AST: 42 U/L — ABNORMAL HIGH (ref 15–41)
Albumin: 2.5 g/dL — ABNORMAL LOW (ref 3.5–5.0)
Alkaline Phosphatase: 165 U/L — ABNORMAL HIGH (ref 38–126)
Anion gap: 4 — ABNORMAL LOW (ref 5–15)
BUN: <5 mg/dL — ABNORMAL LOW (ref 6–20)
CO2: 25 mmol/L (ref 22–32)
Calcium: 8.5 mg/dL — ABNORMAL LOW (ref 8.9–10.3)
Chloride: 107 mmol/L (ref 98–111)
Creatinine, Ser: 0.83 mg/dL (ref 0.44–1.00)
GFR, Estimated: >60 mL/min (ref >60)
Glucose, Bld: 88 mg/dL (ref 70–99)
Potassium: 3.4 mmol/L — ABNORMAL LOW (ref 3.5–5.1)
Sodium: 136 mmol/L (ref 135–145)
Total Bilirubin: 1 mg/dL (ref 0.3–1.2)
Total Protein: 5.9 g/dL — ABNORMAL LOW (ref 6.5–8.1)

## 2021-08-20 LAB — URINALYSIS, ROUTINE W REFLEX MICROSCOPIC
Bilirubin Urine: NEGATIVE
Glucose, UA: NEGATIVE mg/dL
Hgb urine dipstick: NEGATIVE
Ketones, ur: NEGATIVE mg/dL
Leukocytes,Ua: NEGATIVE
Nitrite: NEGATIVE
Protein, ur: NEGATIVE mg/dL
Specific Gravity, Urine: 1.01 (ref 1.005–1.030)
pH: 7 (ref 5.0–8.0)

## 2021-08-20 MED ORDER — FERROUS SULFATE 325 (65 FE) MG PO TABS: 325.0000 mg | ORAL_TABLET | ORAL | 1 refills | Status: AC

## 2021-08-20 MED ORDER — LACTATED RINGERS IV BOLUS
1000.0000 mL | Freq: Once | INTRAVENOUS | Status: AC
  Administered 2021-08-20: 1000 mL via INTRAVENOUS

## 2021-08-20 MED ORDER — AMOXICILLIN-POT CLAVULANATE 875-125 MG PO TABS: 1.0000 | ORAL_TABLET | Freq: Two times a day (BID) | ORAL | 0 refills | Status: AC

## 2021-08-20 MED ORDER — CYCLOBENZAPRINE HCL 5 MG PO TABS
10.0000 mg | ORAL_TABLET | Freq: Once | ORAL | Status: AC
  Administered 2021-08-20: 10 mg via ORAL
  Filled 2021-08-20: qty 2

## 2021-08-20 MED ORDER — ACETAMINOPHEN 500 MG PO TABS
1000.0000 mg | ORAL_TABLET | Freq: Once | ORAL | Status: AC
  Administered 2021-08-20: 1000 mg via ORAL
  Filled 2021-08-20: qty 2

## 2021-08-20 NOTE — MAU Provider Note (Signed)
History     CSN: [id number]  Arrival date and time: 08/20/21 1771   Event Date/Time   First Provider Initiated Contact with Patient 08/20/21 1022      Chief Complaint  Patient presents with   Abdominal Pain   Ms. Karen Hatfield is a 30 y.o. G1P0 at [redacted]w[redacted]d who presents to MAU for abdominal pain. Patient reports the pain is located across the top of her stomach. Patient states the pain started at 4AM this morning. Patient states she was also seen in MAU back in May 2022 for the same pain. Patient states they did some tests to r/o that is was her gallbladder, but there is no imaging on file. Patient states she was given Pepcid to take at that time, but states she has only been taking it as needed, as she was not told to take it daily.  Since May when she was last in the MAU, it did not come back until yesterday, and reports it was very bad. It came around 5AM that morning and eventually patient took a Pepcid and a hot shower and it went away. Patient states she then got up and went to work and had no issues for the rest of the day. Patient was able to eat and drink yesterday without issue. But then today, the pain has been constant since 4AM. The patient reports she vomited once and had a small bowel movement this morning, with her last normal bowel movement yesterday.  Patient reports at this time, the pain is still present at the top of her stomach that is better compared to this morning. Patient rates the pain as 12/10 this morning, and states at this time it is 8/10. Patient states she as not taken anything for pain, but took Pepcid around 7AM, and also tried to take TUMS, but vomited right after. Patient denies nausea and vomiting at this time.  There is no cramping, contractions, or lower abdominal pain. There is no vaginal bleeding or leakage of fluid. She denies constipation or urinary symptoms. She is feeling fetal movement.  Pt denies VB, LOF, ctx, decreased FM, vaginal  discharge/odor/itching. Pt denies constipation, diarrhea, or urinary problems. Pt denies fever, chills, fatigue, sweating or changes in appetite. Pt denies SOB or chest pain. Pt denies dizziness, HA, light-headedness, weakness.  Problems this pregnancy include: none. Allergies? NKDA Current medications/supplements? Pepcid, TUMs, Zofran, PNV Prenatal care provider? [facility], next appt 08/29/2021   OB History     Gravida  1   Para      Term      Preterm      AB      Living         SAB      IAB      Ectopic      Multiple      Live Births              Past Medical History:  Diagnosis Date   Anemia    Asthma    Fainting episodes    Frequent headaches    Migraines    Myalgia and myositis 10/30/2014    Past Surgical History:  Procedure Laterality Date   WISDOM TOOTH EXTRACTION  2019    Family History  Problem Relation Age of Onset   Cancer Mother     Social History   Tobacco Use   Smoking status: Never   Smokeless tobacco: Never  Vaping Use   Vaping Use: Never used  Substance  Use Topics   Alcohol use: Not Currently   Drug use: Not Currently    Allergies: No Known Allergies  Medications Prior to Admission  Medication Sig Dispense Refill Last Dose   calcium carbonate (TUMS - DOSED IN MG ELEMENTAL CALCIUM) 500 MG chewable tablet Chew 1 tablet by mouth daily.   08/20/2021   famotidine (PEPCID) 20 MG tablet Take 1 tablet (20 mg total) by mouth 2 (two) times daily. 60 tablet 1 08/20/2021   Multiple Vitamins-Minerals (AIRBORNE PO) Take by mouth.   08/18/2021   ondansetron (ZOFRAN) 4 MG tablet Take 4 mg by mouth every 8 (eight) hours as needed for nausea or vomiting.   Past Week   albuterol (PROVENTIL HFA;VENTOLIN HFA) 108 (90 BASE) MCG/ACT inhaler Inhale 2 puffs into the lungs every 6 (six) hours as needed for wheezing or shortness of breath. 1 Inhaler 1    beclomethasone (QVAR) 40 MCG/ACT inhaler Inhale 2 puffs into the lungs 2 (two)  times daily at 10 AM and 5 PM. 1 Inhaler 1    hydrOXYzine (ATARAX/VISTARIL) 25 MG tablet Take 1 tablet (25 mg total) by mouth 3 (three) times daily. 45 tablet 1     Review of Systems  Constitutional:  Negative for chills, diaphoresis, fatigue and fever.  Eyes:  Negative for visual disturbance.  Respiratory:  Negative for shortness of breath.   Cardiovascular:  Negative for chest pain.  Gastrointestinal:  Positive for abdominal pain, nausea and vomiting. Negative for constipation and diarrhea.  Genitourinary:  Negative for dysuria, flank pain, frequency, pelvic pain, urgency, vaginal bleeding and vaginal discharge.  Neurological:  Negative for dizziness, weakness, light-headedness and headaches.   Physical Exam   Blood pressure 129/84, pulse 93, temperature 98.2 F (36.8 C), temperature source Oral, resp. rate 17, height  (1.626 m), weight 88.3 kg, last menstrual period 12/16/2020, SpO2 100 %.  Patient Vitals for the past 24 hrs:  BP Temp Temp src Pulse Resp SpO2 Height Weight  08/20/21 0958 129/84 -- -- 93 -- -- -- --  08/20/21 0945 126/78 98.2 F (36.8 C) Oral 94 17 100 % -- --  08/20/21 0919 -- -- -- -- -- --  (1.626 m) 88.3 kg   Physical Exam Vitals and nursing note reviewed.  Constitutional:      General: She is not in acute distress.    Appearance: Normal appearance. She is well-developed. She is not ill-appearing, toxic-appearing or diaphoretic.  HENT:     Head: Normocephalic and atraumatic.  Pulmonary:     Effort: Pulmonary effort is normal.  Abdominal:     General: There is no distension.     Palpations: Abdomen is soft. There is no mass.     Tenderness: There is no abdominal tenderness. There is no guarding or rebound. Negative signs include Murphy's sign.     Comments: Pt reports discomfort on exam, but denies pain.  Skin:    General: Skin is warm and dry.  Neurological:     Mental Status: She is alert and oriented to person, place, and time.   Psychiatric:        Mood and Affect: Mood normal.        Behavior: Behavior normal.        Thought Content: Thought content normal.        Judgment: Judgment normal.   Results for orders placed or performed during the hospital encounter of 08/20/21 (from the past 24 hour(s))  Urinalysis, Routine w reflex microscopic Urine, Clean Catch  Status: None   Collection Time: 08/20/21  9:46 AM  Result Value Ref Range   Color, Urine YELLOW YELLOW   APPearance CLEAR CLEAR   Specific Gravity, Urine 1.010 1.005 - 1.030   pH 7.0 5.0 - 8.0   Glucose, UA NEGATIVE NEGATIVE mg/dL   Hgb urine dipstick NEGATIVE NEGATIVE   Bilirubin Urine NEGATIVE NEGATIVE   Ketones, ur NEGATIVE NEGATIVE mg/dL   Protein, ur NEGATIVE NEGATIVE mg/dL   Nitrite NEGATIVE NEGATIVE   Leukocytes,Ua NEGATIVE NEGATIVE  CBC with Differential/Platelet     Status: Abnormal   Collection Time: 08/20/21 11:09 AM  Result Value Ref Range   WBC 9.6 4.0 - 10.5 K/uL   RBC 3.58 (L) 3.87 - 5.11 MIL/uL   Hemoglobin 9.5 (L) 12.0 - 15.0 g/dL   HCT 40.929.7 (L) 81.136.0 - 91.446.0 %   MCV 83.0 80.0 - 100.0 fL   MCH 26.5 26.0 - 34.0 pg   MCHC 32.0 30.0 - 36.0 g/dL   RDW 78.214.3 95.611.5 - 21.315.5 %   Platelets 227 150 - 400 K/uL   nRBC 0.0 0.0 - 0.2 %   Neutrophils Relative % 70 %   Neutro Abs 6.7 1.7 - 7.7 K/uL   Lymphocytes Relative 18 %   Lymphs Abs 1.8 0.7 - 4.0 K/uL   Monocytes Relative 10 %   Monocytes Absolute 1.0 0.1 - 1.0 K/uL   Eosinophils Relative 1 %   Eosinophils Absolute 0.1 0.0 - 0.5 K/uL   Basophils Relative 0 %   Basophils Absolute 0.0 0.0 - 0.1 K/uL   Immature Granulocytes 1 %   Abs Immature Granulocytes 0.06 0.00 - 0.07 K/uL  Comprehensive metabolic panel     Status: Abnormal   Collection Time: 08/20/21 11:09 AM  Result Value Ref Range   Sodium 136 135 - 145 mmol/L   Potassium 3.4 (L) 3.5 - 5.1 mmol/L   Chloride 107 98 - 111 mmol/L   CO2 25 22 - 32 mmol/L   Glucose, Bld 88 70 - 99 mg/dL   BUN <5 (L) 6 - 20 mg/dL    Creatinine, Ser 0.860.83 0.44 - 1.00 mg/dL   Calcium 8.5 (L) 8.9 - 10.3 mg/dL   Total Protein 5.9 (L) 6.5 - 8.1 g/dL   Albumin 2.5 (L) 3.5 - 5.0 g/dL   AST 42 (H) 15 - 41 U/L   ALT 20 0 - 44 U/L   Alkaline Phosphatase 165 (H) 38 - 126 U/L   Total Bilirubin 1.0 0.3 - 1.2 mg/dL   GFR, Estimated >57>60 >84>60 mL/min   Anion gap 4 (L) 5 - 15    US Abdomen Limited RUQ (LIVER/GB)  Result Date: 08/20/2021 CLINICAL DATA:  Upper abdominal pain, pregnant EXAM: ULTRASOUND ABDOMEN LIMITED RIGHT UPPER QUADRANT COMPARISON:  None. FINDINGS: Gallbladder: Tiny mobile gallstones. No gallbladder wall thickening. Positive sonographic Murphy sign noted by sonographer. Common bile duct: Diameter: 5 mm Liver: No focal lesion identified. Within normal limits in parenchymal echogenicity. Portal vein is patent on color Doppler imaging with normal direction of blood flow towards the liver. Other: None. IMPRESSION: Tiny mobile gallstones. Positive sonographic Murphy sign noted by sonographer, however there is no gallbladder wall thickening, pericholecystic fluid, or biliary ductal dilatation. Findings are equivocal for acute cholecystitis. These results will be called to the ordering clinician or representative by the Radiologist Assistant, and communication documented in the PACS or [employer] Dashboard. Electronically Signed   By: Lauralyn PrimesAlex  Bibbey M.D.   On: 08/20/2021 12:44    MAU Course  Procedures  MDM -upper abdominal pain mimicking abdominal pain from MAU visit 04/2021 -no pain on palpation, just discomfort -no pregnancy concerns -EFM: reactive       -baseline: 125       -variability: moderate       -accels: present, 15x15       -decels: absent       -TOCO: irritability -UA: WNL -CBC: H/H 9.5/29.7 -CMP: K 3.4, AST 42, Anion Gap 4 -RUQ [place]: Tiny mobile gallstones. Positive sonographic Murphy sign noted by sonographer, however there is no gallbladder wall thickening, pericholecystic fluid, or biliary ductal dilatation.  Findings are equivocal for acute cholecystitis. -consulted with Dr. Luisa Hart from general surgery regarding US findings, anion gap, mildly elevated AST and patient presentation. Per Dr. Luisa Hart, if pain is well-controlled, patient is safe to be discharged home with return precautions. Dr. Luisa Hart recommends IV fluids with pain medication in MAU and if discharged home a low fat diet with 5 days of Augmentin for treatment and a referral with general surgery to discuss removal of gallbladder after pregnancy -information relayed to patient, and patient states pain has resolved to 4/10 with heat pack provided by RN -per Dr. Charlotta Newton, OK to give Augmentin -IV fluids, Tylenol and Flexeril ordered, pt states pain has completely resolved -pt discharged to home in stable condition  Orders Placed This Encounter  Procedures   US Abdomen Limited RUQ (LIVER/GB)    Standing Status:   Standing    Number of Occurrences:   1    Order Specific Question:   Symptom/Reason for Exam    Answer:   Upper abdominal pain [650711]   Urinalysis, Routine w reflex microscopic Urine, Clean Catch    Standing Status:   Standing    Number of Occurrences:   1   CBC with Differential/Platelet    Standing Status:   Standing    Number of Occurrences:   1   Comprehensive metabolic panel    Standing Status:   Standing    Number of Occurrences:   1   Ambulatory referral to General Surgery    Referral Priority:   Routine    Referral Type:   Surgical    Referral Reason:   Specialty Services Required    Requested Specialty:   General Surgery    Number of Visits Requested:   1   Insert peripheral IV    Standing Status:   Standing    Number of Occurrences:   1   Discharge patient    Order Specific Question:   Discharge disposition    Answer:   01-Home or Self Care [1]    Order Specific Question:   Discharge patient date    Answer:   08/20/2021   Meds ordered this encounter  Medications   lactated ringers bolus 1,000 mL    cyclobenzaprine (FLEXERIL) tablet 10 mg   acetaminophen (TYLENOL) tablet 1,000 mg   ferrous sulfate 325 (65 FE) MG tablet    Sig: Take 1 tablet (325 mg total) by mouth every other day.    Dispense:  30 tablet    Refill:  1    Order Specific Question:   Supervising Provider    Answer:   Myna Hidalgo [[id number]]   amoxicillin-clavulanate (AUGMENTIN) 875-125 MG tablet    Sig: Take 1 tablet by mouth 2 (two) times daily for 5 days.    Dispense:  10 tablet    Refill:  0    Order Specific Question:   Supervising Provider  AnswerMyna Hidalgo [[id number]]    Assessment and Plan   1. Cholecystitis   2. Upper abdominal pain   3. [redacted] weeks gestation of pregnancy   4. NST (non-stress test) reactive   5. Anemia during pregnancy in third trimester   6. Calculus of gallbladder with acute cholecystitis without obstruction     Allergies as of 08/20/2021   No Known Allergies      Medication List     STOP taking these medications    hydrOXYzine 25 MG tablet Commonly known as: ATARAX/VISTARIL       TAKE these medications    AIRBORNE PO Take by mouth.   albuterol 108 (90 Base) MCG/ACT inhaler Commonly known as: VENTOLIN HFA Inhale 2 puffs into the lungs every 6 (six) hours as needed for wheezing or shortness of breath.   amoxicillin-clavulanate 875-125 MG tablet Commonly known as: Augmentin Take 1 tablet by mouth 2 (two) times daily for 5 days.   beclomethasone 40 MCG/ACT inhaler Commonly known as: Qvar Inhale 2 puffs into the lungs 2 (two) times daily at 10 AM and 5 PM.   calcium carbonate 500 MG chewable tablet Commonly known as: TUMS - dosed in mg elemental calcium Chew 1 tablet by mouth daily.   famotidine 20 MG tablet Commonly known as: PEPCID Take 1 tablet (20 mg total) by mouth 2 (two) times daily.   ferrous sulfate 325 (65 FE) MG tablet Take 1 tablet (325 mg total) by mouth every other day.   ondansetron 4 MG tablet Commonly known as: ZOFRAN Take 4 mg by  mouth every 8 (eight) hours as needed for nausea or vomiting.       -RX Augmentin -RX iron -discussed s/sx of worsening cholecystitis and low fat diet recommended -referral to general surgery -return MAU precautions given -pt discharged to home in stable condition  Joni Reining E Hieu Herms 08/20/2021, 3:14 PM

## 2021-08-20 NOTE — MAU Note (Addendum)
...  Karen Hatfield is a 30 y.o. at [redacted]w[redacted]d here in MAU reporting: Mid/Bilateral upper abdominal pain that began yesterday morning around 0500 that feels like "constant gas pain that does not go away and is sharp." Patient states she took a took Pepcid shortly after, showered around 0630, went back to sleep, then later went on to work as the pain had resolved. She states she woke up this morning around 0500 and thought maybe she had to use the restroom as her online search stated it may be gas or a BM. She states she then took Tums at 0630 and shortly after vomited. She states she felt better a few minutes later but the pain eventually came back. She states she took a shower and went to lie down again in case it was gas. She states around 0700 she took the Pepcid and bounced on her yoga ball and it helped for 5 minutes. She states she then went to bed and fell asleep "from exhaustion." She states she woke up at 0800 and the pain was still there so she decided to come in. Endorses constipation throughout her entire pregnancy. +FM. No VB or LOF.  Lab orders placed from triage: UA Pain score: 8/10 upper/mid/bilateral abdominal pain

## 2021-08-29 LAB — OB RESULTS CONSOLE GBS: GBS: NEGATIVE

## 2021-09-08 ENCOUNTER — Encounter (HOSPITAL_COMMUNITY): Payer: Self-pay | Admitting: Obstetrics and Gynecology

## 2021-09-08 ENCOUNTER — Inpatient Hospital Stay (EMERGENCY_DEPARTMENT_HOSPITAL)
Admission: AD | Admit: 2021-09-08 | Discharge: 2021-09-08 | Disposition: A | Discharge status: Home / Self Care | Payer: BLUE CROSS/BLUE SHIELD | Source: Home / Self Care | Attending: Obstetrics and Gynecology | Admitting: Obstetrics and Gynecology

## 2021-09-08 ENCOUNTER — Other Ambulatory Visit: Payer: Self-pay

## 2021-09-08 DIAGNOSIS — Z3A00 Weeks of gestation of pregnancy not specified: Secondary | ICD-10-CM | POA: Diagnosis not present

## 2021-09-08 DIAGNOSIS — Z3A38 38 weeks gestation of pregnancy: Secondary | ICD-10-CM | POA: Diagnosis not present

## 2021-09-08 DIAGNOSIS — O4292 Full-term premature rupture of membranes, unspecified as to length of time between rupture and onset of labor: Secondary | ICD-10-CM | POA: Diagnosis not present

## 2021-09-08 DIAGNOSIS — O329XX0 Maternal care for malpresentation of fetus, unspecified, not applicable or unspecified: Secondary | ICD-10-CM | POA: Diagnosis not present

## 2021-09-08 DIAGNOSIS — O471 False labor at or after 37 completed weeks of gestation: Secondary | ICD-10-CM | POA: Diagnosis not present

## 2021-09-08 DIAGNOSIS — Z3689 Encounter for other specified antenatal screening: Secondary | ICD-10-CM

## 2021-09-08 NOTE — Discharge Instructions (Signed)

## 2021-09-08 NOTE — MAU Note (Addendum)
Gallstones.Upper abd and back pain since 8-9 pm. No vaginal bleeding. No leaking. Baby moving well. Was 4 cm at last visit.

## 2021-09-08 NOTE — MAU Provider Note (Signed)
Event Date/Time   First Provider Initiated Contact with Patient 09/08/21 0437       S: Ms. Karen Hatfield is a 30 y.o. G1P0 at [redacted]w[redacted]d  who presents to MAU today complaining abdominal and back pain. She states she thinks it may be related to her gallbladder.  However, patient states the pain is intermittent and that the abdominal pain has since resolved upon arrival.  She reports the back pain continues and is intermittent in nature.  She describes the pain as "intense." She denies vaginal bleeding. She denies LOF. She reports normal fetal movement.    O: BP 121/83 (BP Location: Right Arm)   Pulse 82   Temp 98.5 F (36.9 C) (Oral)   Resp 16   Ht 5\' 4"  (1.626 m)   Wt 88 kg   LMP 12/16/2020   SpO2 100%   BMI 33.30 kg/m  GENERAL: Well-developed, well-nourished female in no acute distress.  HEAD: Normocephalic, atraumatic.  CHEST: Normal effort of breathing, regular heart rate ABDOMEN: Soft, nontender, gravid  Cervical exam:  Dilation: 4.5 Effacement (%): 70 Cervical Position: Middle Station: -3 Presentation: Vertex Exam by:: [email or web address], RN   Fetal Monitoring: Baseline: 145 Variability: Moderate Accelerations: Present Decelerations: None Contractions: Q1-80min   A: SIUP at [redacted]w[redacted]d  Contractions NST Reactive   P: -Nurse instructed to evaluate cervix and patient found to be 4.5 cm. -Patient reports she was 4cm in office. -Discussed continued monitoring and will recheck at 2 hour mark (~5 am). -Informed that intermittent back pain is likely contractions. -Reviewed dietary intake and reiterated need to maintain low fat, low carb diet to avoid gallstone attack.  -Discussed need to follow up with general surgery regarding known gallstones. -Will monitor and reassess.   [redacted]w[redacted]d, CNM 09/08/2021 4:39 AM  Reassessment (5:09 AM)  -Patient with no cervical change. -Will give information for general surgery. -Nurse instructed to review labor precautions. -Encouraged  to call primary office or return to MAU if symptoms worsen or with the onset of new symptoms. -Discharged to home in stable condition.  11/08/2021 MSN, CNM Advanced Practice Provider, [facility]

## 2021-09-10 ENCOUNTER — Inpatient Hospital Stay (HOSPITAL_COMMUNITY): Payer: BLUE CROSS/BLUE SHIELD | Admitting: Anesthesiology

## 2021-09-10 ENCOUNTER — Other Ambulatory Visit: Payer: Self-pay

## 2021-09-10 ENCOUNTER — Encounter (HOSPITAL_COMMUNITY): Payer: Self-pay | Admitting: Obstetrics & Gynecology

## 2021-09-10 ENCOUNTER — Inpatient Hospital Stay (HOSPITAL_COMMUNITY)
Admission: AD | Admit: 2021-09-10 | Discharge: 2021-09-13 | DRG: 806 | Disposition: A | Discharge status: Home / Self Care | Payer: BLUE CROSS/BLUE SHIELD | Attending: Obstetrics & Gynecology | Admitting: Obstetrics & Gynecology

## 2021-09-10 DIAGNOSIS — O9081 Anemia of the puerperium: Secondary | ICD-10-CM | POA: Diagnosis not present

## 2021-09-10 DIAGNOSIS — O4290 Premature rupture of membranes, unspecified as to length of time between rupture and onset of labor, unspecified weeks of gestation: Secondary | ICD-10-CM | POA: Diagnosis present

## 2021-09-10 DIAGNOSIS — K8020 Calculus of gallbladder without cholecystitis without obstruction: Secondary | ICD-10-CM | POA: Diagnosis present

## 2021-09-10 DIAGNOSIS — O9962 Diseases of the digestive system complicating childbirth: Secondary | ICD-10-CM | POA: Diagnosis present

## 2021-09-10 DIAGNOSIS — Z3A00 Weeks of gestation of pregnancy not specified: Secondary | ICD-10-CM

## 2021-09-10 DIAGNOSIS — O9952 Diseases of the respiratory system complicating childbirth: Secondary | ICD-10-CM | POA: Diagnosis present

## 2021-09-10 DIAGNOSIS — Z20822 Contact with and (suspected) exposure to covid-19: Secondary | ICD-10-CM | POA: Diagnosis present

## 2021-09-10 DIAGNOSIS — Z3A38 38 weeks gestation of pregnancy: Secondary | ICD-10-CM

## 2021-09-10 DIAGNOSIS — D62 Acute posthemorrhagic anemia: Secondary | ICD-10-CM | POA: Diagnosis not present

## 2021-09-10 DIAGNOSIS — O329XX0 Maternal care for malpresentation of fetus, unspecified, not applicable or unspecified: Secondary | ICD-10-CM

## 2021-09-10 DIAGNOSIS — J45909 Unspecified asthma, uncomplicated: Secondary | ICD-10-CM | POA: Diagnosis present

## 2021-09-10 DIAGNOSIS — O4292 Full-term premature rupture of membranes, unspecified as to length of time between rupture and onset of labor: Secondary | ICD-10-CM | POA: Diagnosis present

## 2021-09-10 DIAGNOSIS — Z23 Encounter for immunization: Secondary | ICD-10-CM | POA: Diagnosis not present

## 2021-09-10 DIAGNOSIS — Z3490 Encounter for supervision of normal pregnancy, unspecified, unspecified trimester: Secondary | ICD-10-CM

## 2021-09-10 HISTORY — DX: Calculus of gallbladder without cholecystitis without obstruction: K80.20

## 2021-09-10 LAB — TYPE AND SCREEN
ABO/RH(D): A POS
Antibody Screen: NEGATIVE

## 2021-09-10 LAB — CBC
HCT: 36.5 % (ref 36.0–46.0)
Hemoglobin: 11.4 g/dL — ABNORMAL LOW (ref 12.0–15.0)
MCH: 25.7 pg — ABNORMAL LOW (ref 26.0–34.0)
MCHC: 31.2 g/dL (ref 30.0–36.0)
MCV: 82.4 fL (ref 80.0–100.0)
Platelets: 249 10*3/uL (ref 150–400)
RBC: 4.43 MIL/uL (ref 3.87–5.11)
RDW: 15.4 % (ref 11.5–15.5)
WBC: 7.6 10*3/uL (ref 4.0–10.5)
nRBC: 0 % (ref 0.0–0.2)

## 2021-09-10 LAB — RESP PANEL BY RT-PCR (FLU A&B, COVID) ARPGX2
Influenza A by PCR: NEGATIVE
Influenza B by PCR: NEGATIVE
SARS Coronavirus 2 by RT PCR: NEGATIVE

## 2021-09-10 LAB — POCT FERN TEST: POCT Fern Test: POSITIVE

## 2021-09-10 MED ORDER — FENTANYL-BUPIVACAINE-NACL 0.5-0.125-0.9 MG/250ML-% EP SOLN
12.0000 mL/h | EPIDURAL | Status: DC | PRN
  Administered 2021-09-10: 12 mL/h via EPIDURAL
  Filled 2021-09-10: qty 250

## 2021-09-10 MED ORDER — PHENYLEPHRINE 40 MCG/ML (10ML) SYRINGE FOR IV PUSH (FOR BLOOD PRESSURE SUPPORT)
80.0000 ug | PREFILLED_SYRINGE | INTRAVENOUS | Status: DC | PRN
  Administered 2021-09-11: 80 ug via INTRAVENOUS

## 2021-09-10 MED ORDER — ONDANSETRON HCL 4 MG/2ML IJ SOLN: 4.0000 mg | Freq: Four times a day (QID) | INTRAMUSCULAR | Status: DC | PRN

## 2021-09-10 MED ORDER — OXYCODONE-ACETAMINOPHEN 5-325 MG PO TABS: 1.0000 | ORAL_TABLET | ORAL | Status: DC | PRN

## 2021-09-10 MED ORDER — FENTANYL CITRATE (PF) 100 MCG/2ML IJ SOLN
50.0000 ug | INTRAMUSCULAR | Status: DC | PRN
  Administered 2021-09-10: 100 ug via INTRAVENOUS
  Filled 2021-09-10: qty 2

## 2021-09-10 MED ORDER — OXYTOCIN BOLUS FROM INFUSION
333.0000 mL | Freq: Once | INTRAVENOUS | Status: AC
  Administered 2021-09-11: 333 mL via INTRAVENOUS

## 2021-09-10 MED ORDER — TERBUTALINE SULFATE 1 MG/ML IJ SOLN: 0.2500 mg | Freq: Once | INTRAMUSCULAR | Status: DC | PRN

## 2021-09-10 MED ORDER — DIPHENHYDRAMINE HCL 50 MG/ML IJ SOLN
12.5000 mg | INTRAMUSCULAR | Status: DC | PRN
  Administered 2021-09-10: 12.5 mg via INTRAVENOUS
  Filled 2021-09-10: qty 1

## 2021-09-10 MED ORDER — PHENYLEPHRINE 40 MCG/ML (10ML) SYRINGE FOR IV PUSH (FOR BLOOD PRESSURE SUPPORT)
80.0000 ug | PREFILLED_SYRINGE | INTRAVENOUS | Status: DC | PRN
  Filled 2021-09-10: qty 10

## 2021-09-10 MED ORDER — LACTATED RINGERS IV SOLN
500.0000 mL | INTRAVENOUS | Status: DC | PRN
Start: 2021-09-10 — End: 2021-09-11

## 2021-09-10 MED ORDER — EPHEDRINE 5 MG/ML INJ
10.0000 mg | INTRAVENOUS | Status: DC | PRN
Start: 2021-09-10 — End: 2021-09-11

## 2021-09-10 MED ORDER — OXYTOCIN-SODIUM CHLORIDE 30-0.9 UT/500ML-% IV SOLN
2.5000 [IU]/h | INTRAVENOUS | Status: DC
  Administered 2021-09-11: 2.5 [IU]/h via INTRAVENOUS

## 2021-09-10 MED ORDER — SOD CITRATE-CITRIC ACID 500-334 MG/5ML PO SOLN: 30.0000 mL | ORAL | Status: DC | PRN

## 2021-09-10 MED ORDER — LIDOCAINE-EPINEPHRINE (PF) 2 %-1:200000 IJ SOLN
INTRAMUSCULAR | Status: DC | PRN
  Administered 2021-09-10: 5 mL via EPIDURAL

## 2021-09-10 MED ORDER — LIDOCAINE HCL (PF) 1 % IJ SOLN: 30.0000 mL | INTRAMUSCULAR | Status: DC | PRN

## 2021-09-10 MED ORDER — OXYCODONE-ACETAMINOPHEN 5-325 MG PO TABS: 2.0000 | ORAL_TABLET | ORAL | Status: DC | PRN

## 2021-09-10 MED ORDER — LACTATED RINGERS IV SOLN
500.0000 mL | Freq: Once | INTRAVENOUS | Status: DC
Start: 2021-09-10 — End: 2021-09-11

## 2021-09-10 MED ORDER — LACTATED RINGERS IV SOLN: INTRAVENOUS | Status: DC

## 2021-09-10 MED ORDER — ACETAMINOPHEN 325 MG PO TABS: 650.0000 mg | ORAL_TABLET | ORAL | Status: DC | PRN

## 2021-09-10 MED ORDER — OXYTOCIN-SODIUM CHLORIDE 30-0.9 UT/500ML-% IV SOLN
1.0000 m[IU]/min | INTRAVENOUS | Status: DC
  Administered 2021-09-10: 2 m[IU]/min via INTRAVENOUS
  Filled 2021-09-10: qty 500

## 2021-09-10 MED ORDER — EPHEDRINE 5 MG/ML INJ: 10.0000 mg | INTRAVENOUS | Status: DC | PRN

## 2021-09-10 NOTE — Anesthesia Procedure Notes (Signed)
Epidural Patient location during procedure: OB Start time: 09/10/2021 9:00 PM End time: 09/10/2021 9:10 PM  Staffing Anesthesiologist: Elmer Picker, MD Performed: anesthesiologist   Preanesthetic Checklist Completed: patient identified, IV checked, risks and benefits discussed, monitors and equipment checked, pre-op evaluation and timeout performed  Epidural Patient position: sitting Prep: DuraPrep and site prepped and draped Patient monitoring: continuous pulse ox, blood pressure, heart rate and cardiac monitor Approach: midline Location: L3-L4 Injection technique: LOR air  Needle:  Needle type: Tuohy  Needle gauge: 17 G Needle length: 9 cm Needle insertion depth: 6 cm Catheter type: closed end flexible Catheter size: 19 Gauge Catheter at skin depth: 11 cm Test dose: negative  Assessment Sensory level: T8 Events: blood not aspirated, injection not painful, no injection resistance, no paresthesia and negative IV test  Additional Notes Patient identified. Risks/Benefits/Options discussed with patient including but not limited to bleeding, infection, nerve damage, paralysis, failed block, incomplete pain control, headache, blood pressure changes, nausea, vomiting, reactions to medication both or allergic, itching and postpartum back pain. Confirmed with bedside nurse the patient's most recent platelet count. Confirmed with patient that they are not currently taking any anticoagulation, have any bleeding history or any family history of bleeding disorders. Patient expressed understanding and wished to proceed. All questions were answered. Sterile technique was used throughout the entire procedure. Please see nursing notes for vital signs. Test dose was given through epidural catheter and negative prior to continuing to dose epidural or start infusion. Warning signs of high block given to the patient including shortness of breath, tingling/numbness in hands, complete motor block,  or any concerning symptoms with instructions to call for help. Patient was given instructions on fall risk and not to get out of bed. All questions and concerns addressed with instructions to call with any issues or inadequate analgesia.  Reason for block:procedure for pain

## 2021-09-10 NOTE — Progress Notes (Signed)
Vtx by BSUS 

## 2021-09-10 NOTE — H&P (Signed)
Karen Hatfield is a 30 y.o. female G1 at [redacted]w[redacted]d presenting for PROM around 7 am today.  Patient reports no CTX until pitocin was started.  No VB and mild cramping.  Active FM.  Patient has h/o migraines and asthma; both stable this pregnancy.  Patient was diagnosed with gallstones (non-obstructing) and cholecystitis in MAU at 35 weeks; plans for surgery postpartum.  Patient is stable in terms of GB symptoms.  GBS negative.  OB History     Gravida  1   Para      Term      Preterm      AB      Living         SAB      IAB      Ectopic      Multiple      Live Births             Past Medical History:  Diagnosis Date   Anemia    Asthma    Fainting episodes    Frequent headaches    Gallstones    Migraines    Myalgia and myositis 10/30/2014   Past Surgical History:  Procedure Laterality Date   WISDOM TOOTH EXTRACTION  2019   Family History: family history includes Cancer in her mother; Depression in her maternal grandfather and maternal grandmother; Heart disease in her paternal grandmother; Hypertension in her maternal grandmother; Stroke in her maternal grandmother; Vision loss in her maternal grandfather. Social History:  reports that she has never smoked. She has never used smokeless tobacco. She reports that she does not currently use alcohol. She reports that she does not currently use drugs.     Maternal Diabetes: No Genetic Screening: Declined Maternal Ultrasounds/Referrals: Normal Fetal Ultrasounds or other Referrals:  None Maternal Substance Abuse:  No Significant Maternal Medications:  None Significant Maternal Lab Results:  Group B Strep negative Other Comments:  None  Review of Systems Maternal Medical History:  Reason for admission: Rupture of membranes.   Contractions: Frequency: rare.   Perceived severity is mild.   Fetal activity: Perceived fetal activity is normal.   Last perceived fetal movement was within the past hour.   Prenatal  complications: no prenatal complications Prenatal Complications - Diabetes: none.  Dilation: 4.5 Effacement (%): 70 Station: -1, 0 Exam by:: Valarie Merino RN Blood pressure 118/81, pulse 95, temperature 98.4 F (36.9 C), resp. rate 17, height 5\' 4"  (1.626 m), weight 86.6 kg, last menstrual period 12/16/2020, SpO2 100 %. Maternal Exam:  Uterine Assessment: Contraction strength is mild.  Contraction frequency is irregular.  Abdomen: Patient reports no abdominal tenderness. Fundal height is c/w dates.   Estimated fetal weight is 7#8.     Fetal Exam Fetal Monitor Review: Baseline rate: 135.  Variability: moderate (6-25 bpm).   Pattern: accelerations present and no decelerations.   Fetal State Assessment: Category I - tracings are normal.  Physical Exam Constitutional:      Appearance: Normal appearance.  HENT:     Head: Normocephalic and atraumatic.  Pulmonary:     Effort: Pulmonary effort is normal.  Abdominal:     Palpations: Abdomen is soft.  Musculoskeletal:        General: Normal range of motion.     Cervical back: Normal range of motion.  Skin:    General: Skin is warm and dry.  Neurological:     Mental Status: She is alert and oriented to person, place, and time.  Psychiatric:  Mood and Affect: Mood normal.        Behavior: Behavior normal.    Prenatal labs: ABO, Rh: --/--/A POS (09/13 1030) Antibody: NEG (09/13 1030) Rubella:  Immune RPR:   NR HBsAg: Negative (06/17 0000)  HIV: Non-reactive (06/17 0000)  GBS: Negative/-- (09/01 0000)   Assessment/Plan: 30yo G1 at [redacted]w[redacted]d with PROM -Continue pitocin -CLEA if desired -Anticipate NSVD   Mitchel Honour 09/10/2021, 1:35 PM

## 2021-09-10 NOTE — Progress Notes (Signed)
Karen Hatfield is a 30 y.o. G1P0 at [redacted]w[redacted]d by ultrasound admitted for rupture of membranes  Subjective: Feeling more intensity with CTX; declines pain medication  Objective: BP 116/74   Pulse (!) 109   Temp 99.2 F (37.3 C) (Axillary)   Resp 14   Ht 5\' 4"  (1.626 m)   Wt 86.6 kg   LMP 12/16/2020   SpO2 100%   BMI 32.77 kg/m  No intake/output data recorded. No intake/output data recorded.  FHT:  FHR: 135 bpm, variability: moderate,  accelerations:  Present,  decelerations:  Absent UC:   regular, every 3-4 minutes SVE:   Dilation: 6 Effacement (%): 80 Station: 0, Plus 1 Exam by:: [email or web address] RN AROM forebag  Labs: Lab Results  Component Value Date   WBC 7.6 09/10/2021   HGB 11.4 (L) 09/10/2021   HCT 36.5 09/10/2021   MCV 82.4 09/10/2021   PLT 249 09/10/2021    Assessment / Plan: Augmentation of labor, progressing well  Labor: Progressing normally; continue to increase pitocin Preeclampsia:   n/a Fetal Wellbeing:  Category I Pain Control:  Labor support without medications I/D:  n/a Anticipated MOD:  NSVD  09/12/2021 09/10/2021, 7:05 PM

## 2021-09-10 NOTE — MAU Note (Addendum)
Patient arrived to MAU complaining of SROM  and loss of mucous plug at 650am. She stated that she was 5cm in her last OB visit she said she called her OB and they told her to come to MAU. No VB. +FM reported.

## 2021-09-10 NOTE — Anesthesia Preprocedure Evaluation (Signed)
Anesthesia Evaluation  Patient identified by MRN, date of birth, ID band Patient awake    Reviewed: Allergy & Precautions, NPO status , Patient's Chart, lab work & pertinent test results  Airway Mallampati: II  TM Distance: >3 FB Neck ROM: Full    Dental no notable dental hx.    Pulmonary asthma ,    Pulmonary exam normal breath sounds clear to auscultation       Cardiovascular negative cardio ROS Normal cardiovascular exam Rhythm:Regular Rate:Normal     Neuro/Psych  Headaches, negative psych ROS   GI/Hepatic Neg liver ROS, gallstones (non-obstructing) and cholecystitis in MAU at 35 weeks; plans for surgery postpartum   Endo/Other  negative endocrine ROS  Renal/GU negative Renal ROS  negative genitourinary   Musculoskeletal negative musculoskeletal ROS (+)   Abdominal   Peds  Hematology negative hematology ROS (+)   Anesthesia Other Findings Presented with PROM at 38.[redacted] weeks gestation  Reproductive/Obstetrics (+) Pregnancy                             Anesthesia Physical Anesthesia Plan  ASA: 2  Anesthesia Plan: Epidural   Post-op Pain Management:    Induction:   PONV Risk Score and Plan: Treatment may vary due to age or medical condition  Airway Management Planned: Natural Airway  Additional Equipment:   Intra-op Plan:   Post-operative Plan:   Informed Consent: I have reviewed the patients History and Physical, chart, labs and discussed the procedure including the risks, benefits and alternatives for the proposed anesthesia with the patient or authorized representative who has indicated his/her understanding and acceptance.       Plan Discussed with: Anesthesiologist  Anesthesia Plan Comments: (Patient identified. Risks, benefits, options discussed with patient including but not limited to bleeding, infection, nerve damage, paralysis, failed block, incomplete pain  control, headache, blood pressure changes, nausea, vomiting, reactions to medication, itching, and post partum back pain. Confirmed with bedside nurse the patient's most recent platelet count. Confirmed with the patient that they are not taking any anticoagulation, have any bleeding history or any family history of bleeding disorders. Patient expressed understanding and wishes to proceed. All questions were answered. )        Anesthesia Quick Evaluation

## 2021-09-11 ENCOUNTER — Encounter (HOSPITAL_COMMUNITY): Payer: Self-pay | Admitting: Obstetrics & Gynecology

## 2021-09-11 DIAGNOSIS — Z3490 Encounter for supervision of normal pregnancy, unspecified, unspecified trimester: Secondary | ICD-10-CM

## 2021-09-11 LAB — CBC
HCT: 29.4 % — ABNORMAL LOW (ref 36.0–46.0)
Hemoglobin: 9.2 g/dL — ABNORMAL LOW (ref 12.0–15.0)
MCH: 25.6 pg — ABNORMAL LOW (ref 26.0–34.0)
MCHC: 31.3 g/dL (ref 30.0–36.0)
MCV: 81.9 fL (ref 80.0–100.0)
Platelets: 218 10*3/uL (ref 150–400)
RBC: 3.59 MIL/uL — ABNORMAL LOW (ref 3.87–5.11)
RDW: 15.2 % (ref 11.5–15.5)
WBC: 10.9 10*3/uL — ABNORMAL HIGH (ref 4.0–10.5)
nRBC: 0 % (ref 0.0–0.2)

## 2021-09-11 LAB — RPR: RPR Ser Ql: NONREACTIVE

## 2021-09-11 MED ORDER — SIMETHICONE 80 MG PO CHEW: 80.0000 mg | CHEWABLE_TABLET | ORAL | Status: DC | PRN

## 2021-09-11 MED ORDER — TETANUS-DIPHTH-ACELL PERTUSSIS 5-2.5-18.5 LF-MCG/0.5 IM SUSY: 0.5000 mL | PREFILLED_SYRINGE | Freq: Once | INTRAMUSCULAR | Status: DC

## 2021-09-11 MED ORDER — SENNOSIDES-DOCUSATE SODIUM 8.6-50 MG PO TABS
2.0000 | ORAL_TABLET | Freq: Every day | ORAL | Status: DC
  Administered 2021-09-12: 2 via ORAL
  Filled 2021-09-11 (×2): qty 2

## 2021-09-11 MED ORDER — COCONUT OIL OIL: 1.0000 "application " | TOPICAL_OIL | Status: DC | PRN

## 2021-09-11 MED ORDER — OXYCODONE-ACETAMINOPHEN 5-325 MG PO TABS: 1.0000 | ORAL_TABLET | ORAL | Status: DC | PRN

## 2021-09-11 MED ORDER — DIBUCAINE (PERIANAL) 1 % EX OINT: 1.0000 "application " | TOPICAL_OINTMENT | CUTANEOUS | Status: DC | PRN

## 2021-09-11 MED ORDER — ONDANSETRON HCL 4 MG/2ML IJ SOLN: 4.0000 mg | INTRAMUSCULAR | Status: DC | PRN

## 2021-09-11 MED ORDER — BENZOCAINE-MENTHOL 20-0.5 % EX AERO
1.0000 "application " | INHALATION_SPRAY | CUTANEOUS | Status: DC | PRN
  Administered 2021-09-11: 1 via TOPICAL
  Filled 2021-09-11: qty 224

## 2021-09-11 MED ORDER — WITCH HAZEL-GLYCERIN EX PADS: 1.0000 "application " | MEDICATED_PAD | CUTANEOUS | Status: DC | PRN

## 2021-09-11 MED ORDER — IBUPROFEN 600 MG PO TABS
600.0000 mg | ORAL_TABLET | Freq: Four times a day (QID) | ORAL | Status: DC
  Administered 2021-09-11 – 2021-09-13 (×9): 600 mg via ORAL
  Filled 2021-09-11 (×10): qty 1

## 2021-09-11 MED ORDER — INFLUENZA VAC SPLIT QUAD 0.5 ML IM SUSY
0.5000 mL | PREFILLED_SYRINGE | INTRAMUSCULAR | Status: AC | PRN
  Administered 2021-09-13: 0.5 mL via INTRAMUSCULAR
  Filled 2021-09-11: qty 0.5

## 2021-09-11 MED ORDER — PRENATAL MULTIVITAMIN CH
1.0000 | ORAL_TABLET | Freq: Every day | ORAL | Status: DC
  Administered 2021-09-11 – 2021-09-13 (×3): 1 via ORAL
  Filled 2021-09-11 (×3): qty 1

## 2021-09-11 MED ORDER — ZOLPIDEM TARTRATE 5 MG PO TABS: 5.0000 mg | ORAL_TABLET | Freq: Every evening | ORAL | Status: DC | PRN

## 2021-09-11 MED ORDER — OXYCODONE-ACETAMINOPHEN 5-325 MG PO TABS
2.0000 | ORAL_TABLET | ORAL | Status: DC | PRN
Start: 2021-09-11 — End: 2021-09-13

## 2021-09-11 MED ORDER — ONDANSETRON HCL 4 MG PO TABS: 4.0000 mg | ORAL_TABLET | ORAL | Status: DC | PRN

## 2021-09-11 MED ORDER — ACETAMINOPHEN 325 MG PO TABS
650.0000 mg | ORAL_TABLET | ORAL | Status: DC | PRN
  Administered 2021-09-11: 650 mg via ORAL
  Filled 2021-09-11: qty 2

## 2021-09-11 MED ORDER — DIPHENHYDRAMINE HCL 25 MG PO CAPS: 25.0000 mg | ORAL_CAPSULE | Freq: Four times a day (QID) | ORAL | Status: DC | PRN

## 2021-09-11 NOTE — Lactation Note (Signed)
This note was copied from a baby's chart. Lactation Consultation Note  Patient Name: Karen Hatfield [id number] Date: 09/11/2021 Reason for consult: Initial assessment;Early term 37-38.6wks;Primapara;1st time breastfeeding Age:32 hours   P1 mother whose infant is now 68 hours old.  This is an ETI at 38+3 weeks.  Lab tech in for morning lab draw.  Mother interested in attempting to latch.  Taught hand expression; no colostrum obtained at this time.  Reviewed breast feeding basics with mother.  Her breasts are large and pendulous; nipples short shafted.  Showed mother how she could position her personal cylinder bed pillow for support prior to latching.  Attempted to latch, however, baby was not at all interested in opening her mouth to initiate sucking.  Reassurance provided.  Swaddled baby and placed in bassinet so mother could rest.    Mom made aware of O/P services, breastfeeding support groups, community resources, and our phone # for post-discharge questions.  Encouraged to call her RN/LC for latch assistance as needed.  Father present and asleep on the couch.   Maternal Data Has patient been taught Hand Expression?: Yes Does the patient have breastfeeding experience prior to this delivery?: No  Feeding Mother's Current Feeding Choice: Breast Milk  LATCH Score Latch: Too sleepy or reluctant, no latch achieved, no sucking elicited.  Audible Swallowing: None  Type of Nipple: Everted at rest and after stimulation (Short shafted)  Comfort (Breast/Nipple): Soft / non-tender  Hold (Positioning): Assistance needed to correctly position infant at breast and maintain latch.  LATCH Score: 5   Lactation Tools Discussed/Used    Interventions Interventions: Breast feeding basics reviewed;Assisted with latch;Skin to skin;Breast massage;Hand express;Adjust position;Position options;Support pillows;Education  Discharge    Consult Status Consult Status: Follow-up Date:  09/12/21 Follow-up type: In-patient    Karen Hatfield 09/11/2021, 5:32 AM

## 2021-09-11 NOTE — Lactation Note (Signed)
This note was copied from a baby's chart. Lactation Consultation Note  Patient Name: Karen Hatfield [id number] Date: 09/11/2021 Reason for consult: Follow-up assessment;Early term 37-38.6wks;Primapara;1st time breastfeeding Age:32 hours  LC in to room for follow up. Infant is 21h and has not fed well. Reviewed hand expression, demonstrated hand pump and DEBP use  including care of parts and milk storage. Infant is awake, LC assisted with latch, and succeeded after a few attempts. Audible swallows. Mother denies discomfort or pain.  Discussed feeding frequency, stimulate infant to stay awake for feedings, pumping frequency.  Father is supportive and hands-on.   Plan:   1-Breastfeed 8-12 x in 24h 2-Use hand pump or DEBP for stimulation and supplementation every 3h 3-Maternal self care.   All questions answered. Parents able to teach plan back. Contact LC as needed for feeds/support/concerns/questions.   Maternal Data Has patient been taught Hand Expression?: Yes  Feeding Mother's Current Feeding Choice: Breast Milk and Formula  LATCH Score Latch: Grasps breast easily, tongue down, lips flanged, rhythmical sucking.  Audible Swallowing: Spontaneous and intermittent  Type of Nipple: Everted at rest and after stimulation (short shafted, pliable tissue)  Comfort (Breast/Nipple): Soft / non-tender  Hold (Positioning): Assistance needed to correctly position infant at breast and maintain latch.  LATCH Score: 9   Lactation Tools Discussed/Used Tools: Pump;Flanges Flange Size: 21 Breast pump type: Double-Electric Breast Pump;Manual Pump Education: Milk Storage;Setup, frequency, and cleaning Reason for Pumping: stimulation and supplementation Pumping frequency: Q3 Pumped volume:  (has not started yet)  Interventions Interventions: Assisted with latch;Hand express;DEBP;Hand pump;Expressed milk;Education;Adjust position;Support pillows;Breast compression;Skin to skin;Breast  feeding basics reviewed  Discharge Pump: Personal;Manual;DEBP  Consult Status Consult Status: Follow-up Date: 09/12/21 Follow-up type: In-patient    Luv Mish A Higuera Ancidey 09/11/2021, 9:32 PM

## 2021-09-11 NOTE — Lactation Note (Signed)
This note was copied from a baby's chart. Lactation Consultation Note  Patient Name: Karen Hatfield [id number] Date: 09/11/2021   Age:33 hours   LC Note:  Per RN, family is sleeping.  RN will call when mother awakens for first LC visit.   Maternal Data    Feeding    LATCH Score                    Lactation Tools Discussed/Used    Interventions    Discharge    Consult Status      [name] [name] 09/11/2021, 3:49 AM

## 2021-09-11 NOTE — Progress Notes (Signed)
Post Partum Day 0 Subjective: up ad lib, voiding, tolerating PO, + flatus, and tired  Objective: Blood pressure 111/71, pulse 98, temperature 97.7 F (36.5 C), temperature source Oral, resp. rate 18, height 5\' 4"  (1.626 m), weight 86.6 kg, last menstrual period 12/16/2020, SpO2 100 %, unknown if currently breastfeeding.  Physical Exam:  General: alert, cooperative, and no distress Lochia: appropriate Uterine Fundus: firm Incision: healing well DVT Evaluation: No evidence of DVT seen on physical exam.  Recent Labs    09/10/21 1030 09/11/21 0511  HGB 11.4* 9.2*  HCT 36.5 29.4*    Assessment/Plan: Plan for discharge tomorrow   LOS: 1 day   09/13/21 II 09/11/2021, 7:10 AM

## 2021-09-11 NOTE — Anesthesia Postprocedure Evaluation (Signed)
Anesthesia Post Note  Patient: Karen Hatfield  Procedure(s) Performed: AN AD HOC LABOR EPIDURAL     Patient location during evaluation: Mother Baby Anesthesia Type: Epidural Level of consciousness: awake, oriented and awake and alert Pain management: pain level controlled Vital Signs Assessment: post-procedure vital signs reviewed and stable Respiratory status: spontaneous breathing, respiratory function stable and nonlabored ventilation Cardiovascular status: stable Postop Assessment: no headache, adequate PO intake, able to ambulate, patient able to bend at knees and no apparent nausea or vomiting Anesthetic complications: no   No notable events documented.  Last Vitals:  Vitals:   09/11/21 0826 09/11/21 1230  BP: 99/66 103/60  Pulse: 83 90  Resp: 16 17  Temp: 36.6 C 36.8 C  SpO2: 100% 100%    Last Pain:  Vitals:   09/11/21 1230  TempSrc: Axillary  PainSc: 0-No pain   Pain Goal:                   Olamae Ferrara

## 2021-09-12 NOTE — Clinical Social Work Maternal (Signed)
CLINICAL SOCIAL WORK MATERNAL/CHILD NOTE  Patient Details  Name: Karen Hatfield MRN: [id number] Date of Birth: 01/23/1991  Date:  2021-07-28  Clinical Social Worker Initiating Note:  Karen Hatfield, Karen Hatfield Date/Time: Initiated:  09/12/21/0940     Child's Name:  Karen Hatfield   Biological Parents:  Mother, Father Karen Hatfield 09/17/1988)   Need for Interpreter:  None   Reason for Referral:  Behavioral Health Concerns, Current Substance Use/Substance Use During Pregnancy     Address:  [address] [place] [address]    Phone number:  [phone] (home)     Additional phone number:   Household Members/Support Persons (HM/SP):   Household Member/Support Person 1   HM/SP Name Relationship DOB or Age  HM/SP -1 Karen Hatfield 09/17/1988  HM/SP -2        HM/SP -3        HM/SP -4        HM/SP -5        HM/SP -6        HM/SP -7        HM/SP -8          Natural Supports (not living in the home):  Immediate Family, Friends, Spouse/significant other   Professional Supports: None   Employment: Animator   Type of Work: Recruitment consultant   Education:  Forensic psychologist   Homebound arranged:    Museum/gallery curator Resources:  Multimedia programmer    Other Resources:  [facility]   Cultural/Religious Considerations Which May Impact Care:    Strengths:  Ability to meet basic needs  , Lexicographer chosen, Home prepared for child     Psychotropic Medications:         Pediatrician:    [place] area  Pediatrician List:   [place] Other ([facility])  [place]      Pediatrician Fax Number:    Risk Factors/Current Problems:  None   Cognitive State:  Goal Oriented  , Insightful  , Linear Thinking  , Alert     Mood/Affect:  Interested  , Bright  , Calm  , Relaxed     CSW Assessment: CSW consulted for history of depression and THC use. CSW met with MOB to  assess and offer support. CSW introduced self and role. CSW observed FOB bedside feeding infant. MOB declined to have FOB leave the room for assessment. CSW informed MOB of reason for consult and assessed emotions. MOB reported she is doing okay. MOB reported she has never officially been given a diagnosis of anxiety, however she experiences it at times. MOB shared she knows how to manage it and understands everyday is not going to be the best. MOB reported she manages by engaging in activities such as painting, crafting and exercising. MOB stated she has also engaged in therapy in the past, which she found helpful. MOB reported she was also on medication in high school. MOB shared she has a strong support system consisting of FOB, her siblings, mom and friends. MOB denies any current SI or HI.  CSW inquired on substance use. MOB reported the prenatal record is  inaccurate and that she has not used substances since prior to pregnancy. CSW expressed understanding and informed MOB of the hospital drug screen policy. MOB aware infant's UDS is negative and the CDS will be followed. CSW informed MOB that a CPS report will be made  if infant test positive for substances. MOB expressed understanding and denied any questions.   CSW provided education regarding the baby blues period versus PPD and provided resources. CSW provided the New Mom Checklist and encouraged MOB to self evaluate and contact a medical professional if symptoms are noted at any time.   CSW provided review of Sudden Infant Death Syndrome (SIDS) precautions. MOB stated she has all infant essentials, including a bassinet and car seat. MOB denies any barriers to infant's follow-up care. MOB reported she is currently enrolled in WIC and will make contact to have infant added to benefits. MOB reported no additional needs at this time.   CSW will continue to follow CDS and make a CPS report if warranted. CSW identifies no further need for intervention  and no barriers to discharge at this time.  CSW Plan/Description:  No Further Intervention Required/No Barriers to Discharge, CSW Will Continue to Monitor Umbilical Cord Tissue Drug Screen Results and Make Report if Warranted, Child Protective Service Report  , Hospital Drug Screen Policy Information, Perinatal Mood and Anxiety Disorder (PMADs) Education, Sudden Infant Death Syndrome (SIDS) Education, Other Information/Referral to Community Resources    Karen Hatfield J Karen Hatfield, LCSWA 09/12/2021, 9:58 AM 

## 2021-09-12 NOTE — Lactation Note (Addendum)
This note was copied from a baby's chart. Lactation Consultation Note  Patient Name: Karen Hatfield Hearty MVHQI'O Date: 09/12/2021 Reason for consult: Follow-up assessment;Mother's request;Difficult latch;Primapara;1st time breastfeeding;Early term 37-38.6wks;Other (Comment) (Anemia) Age:32 hours  Infant fed 40 ml of formula one hr prior. LC able to get infant to latch in sideline with breasts on a pillow.   Mom to work on breastfeeding and call for latch assistance.   Plan 1. To feed based on cues 8-12x 24 hr period.  2. Mom to offer breasts and look for signs of milk transfer  3. Mom to supplement with EBM first followed by formula with yellow slow flow nipple and pace bottle feeding. BF supplementation guide provided. Mom to offer more if infant not latching. As she increases volume give half, burp then offer more.  4. DEBP q 3 hrs for 15 min  5 LC brochure of inpatient and outpatient services reviewed.  All questions answered at end of the visit.   Maternal Data Has patient been taught Hand Expression?: Yes  Feeding Mother's Current Feeding Choice: Breast Milk and Formula Nipple Type: Slow - flow  LATCH Score Latch: Repeated attempts needed to sustain latch, nipple held in mouth throughout feeding, stimulation needed to elicit sucking reflex.  Audible Swallowing: A few with stimulation  Type of Nipple: Everted at rest and after stimulation  Comfort (Breast/Nipple): Soft / non-tender  Hold (Positioning): Assistance needed to correctly position infant at breast and maintain latch.  LATCH Score: 7   Lactation Tools Discussed/Used Tools: Pump;Flanges;Coconut oil Flange Size: 21 Breast pump type: Double-Electric Breast Pump Pump Education: Setup, frequency, and cleaning;Milk Storage Reason for Pumping: increase stimulation  Interventions Interventions: Breast feeding basics reviewed;Breast compression;Assisted with latch;Adjust position;Skin to skin;Support  pillows;DEBP;Breast massage;Position options;Expressed milk;Hand express;Education;Coconut oil;Pace feeding  Discharge Discharge Education: Engorgement and breast care;Warning signs for feeding baby;Outpatient recommendation Pump: Personal  Consult Status Consult Status: Follow-up Date: 09/13/21 Follow-up type: In-patient    Correy Weidner  Nicholson-Springer 09/12/2021, 4:42 PM

## 2021-09-12 NOTE — Discharge Summary (Signed)
Postpartum Discharge Summary  Date of Service updated 09/12/21     Patient Name: Karen Hatfield DOB: June 29, 1991 MRN: [id number]  Date of admission: 09/10/2021 Delivery date:09/11/2021  Delivering provider: Linda Hedges  Date of discharge: 09/12/2021  Admitting diagnosis: PROM (premature rupture of membranes) [O42.90] Pregnancy [Z34.90] Intrauterine pregnancy: [redacted]w[redacted]d    Secondary diagnosis:  Active Problems:   PROM (premature rupture of membranes)   Pregnancy  Additional problems: anemia - to continue PNV with Fe    Discharge diagnosis: Term Pregnancy Delivered                                              Post partum procedures: none Augmentation: Pitocin Complications: None  Hospital course: Induction of Labor With Vaginal Delivery   30y.o. yo G1P1001 at 334w3das admitted to the hospital 09/10/2021 for induction of labor.  Indication for induction: PROM.  Patient had an uncomplicated labor course as follows: Membrane Rupture Time/Date: 7:01 PM ,09/10/2021   Delivery Method:Vaginal, Spontaneous  Episiotomy: None  Lacerations:  Vaginal  Details of delivery can be found in separate delivery note.  Patient had a routine postpartum course. Patient is discharged home 09/12/21.  Newborn Data: Birth date:09/11/2021  Birth time:12:27 AM  Gender:Female  Living status:Living  Apgars:9 ,9  Weight:3090 g   Magnesium Sulfate received: No BMZ received: No Rhophylac:N/A MMR:N/A Flu: N/A Transfusion:No  Physical exam  Vitals:   09/11/21 1230 09/11/21 1632 09/11/21 1938 09/12/21 0550  BP: 103/60 114/73 111/75 98/61  Pulse: 90 96 89 93  Resp: 17 18 18 16   Temp: 98.2 F (36.8 C) 98 F (36.7 C) 98.2 F (36.8 C) 98 F (36.7 C)  TempSrc: Axillary Axillary Oral Oral  SpO2: 100% 100%  99%  Weight:      Height:       General: alert and cooperative Lochia: appropriate Uterine Fundus: firm Incision: Healing well with no significant drainage, N/A DVT Evaluation: No evidence  of DVT seen on physical exam. Labs: Lab Results  Component Value Date   WBC 10.9 (H) 09/11/2021   HGB 9.2 (L) 09/11/2021   HCT 29.4 (L) 09/11/2021   MCV 81.9 09/11/2021   PLT 218 09/11/2021   CMP Latest Ref Rng & Units 08/20/2021  Glucose 70 - 99 mg/dL 88  BUN 6 - 20 mg/dL <5(L)  Creatinine 0.44 - 1.00 mg/dL 0.83  Sodium 135 - 145 mmol/L 136  Potassium 3.5 - 5.1 mmol/L 3.4(L)  Chloride 98 - 111 mmol/L 107  CO2 22 - 32 mmol/L 25  Calcium 8.9 - 10.3 mg/dL 8.5(L)  Total Protein 6.5 - 8.1 g/dL 5.9(L)  Total Bilirubin 0.3 - 1.2 mg/dL 1.0  Alkaline Phos 38 - 126 U/L 165(H)  AST 15 - 41 U/L 42(H)  ALT 0 - 44 U/L 20   Edinburgh Score: Edinburgh Postnatal Depression Scale Screening Tool 09/11/2021  I have been able to laugh and see the funny side of things. 0  I have looked forward with enjoyment to things. 0  I have blamed myself unnecessarily when things went wrong. 2  I have been anxious or worried for no good reason. 2  I have felt scared or panicky for no good reason. 1  Things have been getting on top of me. 2  I have been so unhappy that I have had difficulty sleeping. 0  I  have felt sad or miserable. 1  I have been so unhappy that I have been crying. 1  The thought of harming myself has occurred to me. 0  Edinburgh Postnatal Depression Scale Total 9      After visit meds:  Allergies as of 09/12/2021   No Known Allergies      Medication List     STOP taking these medications    AIRBORNE PO   ondansetron 4 MG tablet Commonly known as: ZOFRAN       TAKE these medications    albuterol 108 (90 Base) MCG/ACT inhaler Commonly known as: VENTOLIN HFA Inhale 2 puffs into the lungs every 6 (six) hours as needed for wheezing or shortness of breath.   beclomethasone 40 MCG/ACT inhaler Commonly known as: Qvar Inhale 2 puffs into the lungs 2 (two) times daily at 10 AM and 5 PM.   calcium carbonate 500 MG chewable tablet Commonly known as: TUMS - dosed in mg  elemental calcium Chew 1 tablet by mouth daily.   famotidine 20 MG tablet Commonly known as: PEPCID Take 1 tablet (20 mg total) by mouth 2 (two) times daily.   ferrous sulfate 325 (65 FE) MG tablet Take 1 tablet (325 mg total) by mouth every other day.         Discharge home in stable condition Infant Feeding: Bottle and Breast Infant Disposition:home with mother Discharge instruction: per After Visit Summary and Postpartum booklet. Activity: Advance as tolerated. Pelvic rest for 6 weeks.  Diet: routine diet Anticipated Birth Control: Unsure Postpartum Appointment:6 weeks Additional Postpartum F/U:  none Future Appointments:No future appointments. Follow up Visit:      09/12/2021 Tyson Dense, MD

## 2021-09-13 NOTE — Lactation Note (Signed)
This note was copied from a baby's chart. Lactation Consultation Note  Patient Name: Karen Hatfield Date: 09/13/2021 Reason for consult: Follow-up assessment Age:33 hours  P1, Mother is breastfeeding and giving baby formula. Reviewed volume guidelines. Feed on demand with cues.  Goal 8-12+ times per day after first 24 hrs.  Place baby STS if not cueing.  Reviewed engorgement care and monitoring voids/stools. Recommend post pumping and giving volume back to baby.  Feeding Mother's Current Feeding Choice: Breast Milk and Formula Nipple Type: Slow - flow   Interventions Interventions: Education  Discharge  Reviewed engorgement care and monitoring voids/stools.   Consult Status Consult Status: Complete Date: 09/13/21    [name] [facility] 09/13/2021, 10:47 AM

## 2021-09-13 NOTE — Discharge Summary (Signed)
Postpartum Discharge Summary  Date of Service updated September 13, 2021     Patient Name: Karen Hatfield DOB: Nov 30, 1991 MRN: [id number]  Date of admission: 09/10/2021 Delivery date:09/11/2021  Delivering provider: Linda Hedges  Date of discharge: 09/13/2021  Admitting diagnosis: PROM (premature rupture of membranes) [O42.90] Pregnancy [Z34.90] Intrauterine pregnancy: [redacted]w[redacted]d    Secondary diagnosis:  Active Problems:   PROM (premature rupture of membranes)   Pregnancy  Additional problems: none    Discharge diagnosis: Term Pregnancy Delivered                                              Post partum procedures: none Augmentation: Pitocin Complications: RNXG>33hours  Hospital course: Onset of Labor With Vaginal Delivery      30y.o. yo G1P1001 at 32w3das admitted in Latent Labor on 09/10/2021. Patient had an uncomplicated labor course as follows:  Membrane Rupture Time/Date: 7:01 PM ,09/10/2021   Delivery Method:Vaginal, Spontaneous  Episiotomy: None  Lacerations:  Vaginal  Patient had an uncomplicated postpartum course.  She is ambulating, tolerating a regular diet, passing flatus, and urinating well. Patient is discharged home in stable condition on 09/13/21.  Newborn Data: Birth date:09/11/2021  Birth time:12:27 AM  Gender:Female  Living status:Living  Apgars:9 ,9  Weight:3090 g   Magnesium Sulfate received: No BMZ received: No Rhophylac:N/A MMR:N/A T-DaP:Given prenatally Flu: N/A Transfusion:No  Physical exam  Vitals:   09/11/21 1632 09/11/21 1938 09/12/21 0550 09/12/21 2116  BP: 114/73 111/75 98/61 103/60  Pulse: 96 89 93 90  Resp: _0 Temp: 98 F (36.7 C) 98.2 F (36.8 C) 98 F (36.7 C) 98.3 F (36.8 C)  TempSrc: Axillary Oral Oral Oral  SpO2: 100%  99%   Weight:      Height:       General: alert, cooperative, and no distress Lochia: appropriate Uterine Fundus: firm Incision: Healing well with no significant drainage DVT Evaluation:  No evidence of DVT seen on physical exam. Labs: Lab Results  Component Value Date   WBC 10.9 (H) 09/11/2021   HGB 9.2 (L) 09/11/2021   HCT 29.4 (L) 09/11/2021   MCV 81.9 09/11/2021   PLT 218 09/11/2021   CMP Latest Ref Rng & Units 08/20/2021  Glucose 70 - 99 mg/dL 88  BUN 6 - 20 mg/dL <5(L)  Creatinine 0.44 - 1.00 mg/dL 0.83  Sodium 135 - 145 mmol/L 136  Potassium 3.5 - 5.1 mmol/L 3.4(L)  Chloride 98 - 111 mmol/L 107  CO2 22 - 32 mmol/L 25  Calcium 8.9 - 10.3 mg/dL 8.5(L)  Total Protein 6.5 - 8.1 g/dL 5.9(L)  Total Bilirubin 0.3 - 1.2 mg/dL 1.0  Alkaline Phos 38 - 126 U/L 165(H)  AST 15 - 41 U/L 42(H)  ALT 0 - 44 U/L 20   Edinburgh Score: Edinburgh Postnatal Depression Scale Screening Tool 09/11/2021  I have been able to laugh and see the funny side of things. 0  I have looked forward with enjoyment to things. 0  I have blamed myself unnecessarily when things went wrong. 2  I have been anxious or worried for no good reason. 2  I have felt scared or panicky for no good reason. 1  Things have been getting on top of me. 2  I have been so unhappy that I have had difficulty sleeping. 0  I have felt sad or miserable. 1  I have been so unhappy that I have been crying. 1  The thought of harming myself has occurred to me. 0  Edinburgh Postnatal Depression Scale Total 9      After visit meds:  Allergies as of 09/13/2021   No Known Allergies      Medication List     STOP taking these medications    AIRBORNE PO   ondansetron 4 MG tablet Commonly known as: ZOFRAN       TAKE these medications    albuterol 108 (90 Base) MCG/ACT inhaler Commonly known as: VENTOLIN HFA Inhale 2 puffs into the lungs every 6 (six) hours as needed for wheezing or shortness of breath.   beclomethasone 40 MCG/ACT inhaler Commonly known as: Qvar Inhale 2 puffs into the lungs 2 (two) times daily at 10 AM and 5 PM.   calcium carbonate 500 MG chewable tablet Commonly known as: TUMS - dosed  in mg elemental calcium Chew 1 tablet by mouth daily.   famotidine 20 MG tablet Commonly known as: PEPCID Take 1 tablet (20 mg total) by mouth 2 (two) times daily.   ferrous sulfate 325 (65 FE) MG tablet Take 1 tablet (325 mg total) by mouth every other day.         Discharge home in stable condition Infant Feeding: Bottle Infant Disposition:home with mother Discharge instruction: per After Visit Summary and Postpartum booklet. Activity: Advance as tolerated. Pelvic rest for 6 weeks.  Diet: routine diet Anticipated Birth Control: Unsure Postpartum Appointment:6 weeks Additional Postpartum F/U:  not applicable Future Appointments:No future appointments. Follow up Visit:      09/13/2021 Cyril Mourning, MD

## 2021-09-19 ENCOUNTER — Ambulatory Visit: Payer: Self-pay | Admitting: General Surgery

## 2021-09-25 ENCOUNTER — Telehealth (HOSPITAL_COMMUNITY): Payer: Self-pay | Admitting: *Deleted

## 2021-09-25 NOTE — Telephone Encounter (Signed)
Hospital Discharge Follow-Up Call:  Patient reports that she is doing well and has no concerns about her healing process.  EPDS today was a 3 and patient says that this accurately reflects that she is doing well emotionally.  Patient reports that baby is well and she has no concerns about baby's health.  She had questions about her milk supply and pumping.  Gave her the number for out-patient lactation consultant.  She says that baby sleeps in a bassinet.  Reviewed ABCs of Safe Sleep.

## 2021-10-29 NOTE — Progress Notes (Signed)
Surgical Instructions    Your procedure is scheduled on Monday, November 7th, 2022.   Report to [facility] Main Entrance "A" at 08:30 A.M., then check in with the Admitting office.  Call this number if you have problems the morning of surgery:  [phone]   If you have any questions prior to your surgery date call [phone]: Open Monday-Friday 8am-4pm    Remember:  Do not eat after midnight the night before your surgery  You may drink clear liquids until 07:30 the morning of your surgery.   Clear liquids allowed are: Water, Non-Citrus Juices (without pulp), Carbonated Beverages, Clear Tea, Black Coffee ONLY (NO MILK, CREAM OR POWDERED CREAMER of any kind), and Gatorade    Take these medicines the morning of surgery with A SIP OF WATER:  acetaminophen (TYLENOL) - if needed  albuterol (PROVENTIL HFA;VENTOLIN HFA) - if needed (please, bring the inhaler with you the day of surgery)   As of today, STOP taking any Aspirin (unless otherwise instructed by your surgeon) Aleve, Naproxen, Ibuprofen, Motrin, Advil, Goody's, BC's, all herbal medications, fish oil, and all vitamins.   After your COVID test   You are not required to quarantine however you are required to wear a well-fitting mask when you are out and around people not in your household.  If your mask becomes wet or soiled, replace with a new one.  Wash your hands often with soap and water for 20 seconds or clean your hands with an alcohol-based hand sanitizer that contains at least 60% alcohol.  Do not share personal items.  Notify your provider: if you are in close contact with someone who has COVID  or if you develop a fever of 100.4 or greater, sneezing, cough, sore throat, shortness of breath or body aches.    The day of surgery:          Do not wear jewelry or makeup Do not wear lotions, powders, perfumes, or deodorant. Do not shave 48 hours prior to surgery.   Do not bring valuables to the hospital. DO Not  wear nail polish, gel polish, artificial nails, or any other type of covering on natural nails including finger and toenails. If patients have artificial nails, gel coating, etc. that need to be removed by a nail salon, please have this removed prior to surgery or surgery may need to be canceled/delayed if the surgeon/ anesthesia feels like the patient is unable to be adequately monitored.              [facility] is not responsible for any belongings or valuables.  Do NOT Smoke (Tobacco/Vaping)  24 hours prior to your procedure  If you use a CPAP at night, you may bring your mask for your overnight stay.   Contacts, glasses, hearing aids, dentures or partials may not be worn into surgery, please bring cases for these belongings   For patients admitted to the hospital, discharge time will be determined by your treatment team.   Patients discharged the day of surgery will not be allowed to drive home, and someone needs to stay with them for 24 hours.  NO VISITORS WILL BE ALLOWED IN PRE-OP WHERE PATIENTS ARE PREPPED FOR SURGERY.  ONLY 1 SUPPORT PERSON MAY BE PRESENT IN THE WAITING ROOM WHILE YOU ARE IN SURGERY.  IF YOU ARE TO BE ADMITTED, ONCE YOU ARE IN YOUR ROOM YOU WILL BE ALLOWED TWO (2) VISITORS. 1 (ONE) VISITOR MAY STAY OVERNIGHT BUT MUST ARRIVE TO THE ROOM BY  8pm.  Minor children may have two parents present. Special consideration for safety and communication needs will be reviewed on a case by case basis.  Special instructions:    Oral Hygiene is also important to reduce your risk of infection.  Remember - BRUSH YOUR TEETH THE MORNING OF SURGERY WITH YOUR REGULAR TOOTHPASTE   [facility]- Preparing For Surgery  Before surgery, you can play an important role. Because skin is not sterile, your skin needs to be as free of germs as possible. You can reduce the number of germs on your skin by washing with CHG (chlorahexidine gluconate) Soap before surgery.  CHG is an antiseptic cleaner  which kills germs and bonds with the skin to continue killing germs even after washing.     Please do not use if you have an allergy to CHG or antibacterial soaps. If your skin becomes reddened/irritated stop using the CHG.  Do not shave (including legs and underarms) for at least 48 hours prior to first CHG shower. It is OK to shave your face.  Please follow these instructions carefully.     Shower the NIGHT BEFORE SURGERY and the MORNING OF SURGERY with CHG Soap.   If you chose to wash your hair, wash your hair first as usual with your normal shampoo. After you shampoo, rinse your hair and body thoroughly to remove the shampoo.  Then [employer] and genitals (private parts) with your normal soap and rinse thoroughly to remove soap.  After that Use CHG Soap as you would any other liquid soap. You can apply CHG directly to the skin and wash gently with a scrungie or a clean washcloth.   Apply the CHG Soap to your body ONLY FROM THE NECK DOWN.  Do not use on open wounds or open sores. Avoid contact with your eyes, ears, mouth and genitals (private parts). Wash Face and genitals (private parts)  with your normal soap.   Wash thoroughly, paying special attention to the area where your surgery will be performed.  Thoroughly rinse your body with warm water from the neck down.  DO NOT shower/wash with your normal soap after using and rinsing off the CHG Soap.  Pat yourself dry with a CLEAN TOWEL.  Wear CLEAN PAJAMAS to bed the night before surgery  Place CLEAN SHEETS on your bed the night before your surgery  DO NOT SLEEP WITH PETS.   Day of Surgery:  Take a shower with CHG soap. Wear Clean/Comfortable clothing the morning of surgery Do not apply any deodorants/lotions.   Remember to brush your teeth WITH YOUR REGULAR TOOTHPASTE.   Please read over the following fact sheets that you were given.

## 2021-10-30 ENCOUNTER — Encounter (HOSPITAL_COMMUNITY)
Admission: RE | Admit: 2021-10-30 | Discharge: 2021-10-30 | Disposition: A | Discharge status: Home / Self Care | Payer: BLUE CROSS/BLUE SHIELD | Source: Ambulatory Visit | Attending: General Surgery | Admitting: General Surgery

## 2021-10-30 ENCOUNTER — Encounter (HOSPITAL_COMMUNITY): Payer: Self-pay

## 2021-10-30 ENCOUNTER — Other Ambulatory Visit: Payer: Self-pay

## 2021-10-30 VITALS — BP 130/95 | HR 78 | Temp 98.2°F | Resp 17 | Ht 65.0 in | Wt 187.4 lb

## 2021-10-30 DIAGNOSIS — Z01812 Encounter for preprocedural laboratory examination: Secondary | ICD-10-CM | POA: Insufficient documentation

## 2021-10-30 DIAGNOSIS — K819 Cholecystitis, unspecified: Secondary | ICD-10-CM | POA: Insufficient documentation

## 2021-10-30 HISTORY — DX: Dyspnea, unspecified: R06.00

## 2021-10-30 LAB — CBC
HCT: 40.3 % (ref 36.0–46.0)
Hemoglobin: 12.4 g/dL (ref 12.0–15.0)
MCH: 25.6 pg — ABNORMAL LOW (ref 26.0–34.0)
MCHC: 30.8 g/dL (ref 30.0–36.0)
MCV: 83.1 fL (ref 80.0–100.0)
Platelets: 256 10*3/uL (ref 150–400)
RBC: 4.85 MIL/uL (ref 3.87–5.11)
RDW: 18.3 % — ABNORMAL HIGH (ref 11.5–15.5)
WBC: 4.9 10*3/uL (ref 4.0–10.5)
nRBC: 0 % (ref 0.0–0.2)

## 2021-10-30 NOTE — Progress Notes (Signed)
PCP - Dr. Lidia Collum Cardiologist - Denies  Chest x-ray - Not indicated EKG - Not indicated  Sleep Study - Denies  DM - Denies  ERAS Protcol - Yes  COVID TEST- Not indicated  Anesthesia review: No  Patient denies shortness of breath, fever, cough and chest pain at PAT appointment   All instructions explained to the patient, with a verbal understanding of the material. Patient agrees to go over the instructions while at home for a better understanding. The opportunity to ask questions was provided.

## 2021-11-04 ENCOUNTER — Encounter (HOSPITAL_COMMUNITY)
Admission: RE | Disposition: A | Discharge status: Home / Self Care | Payer: Self-pay | Source: Home / Self Care | Attending: General Surgery

## 2021-11-04 ENCOUNTER — Encounter (HOSPITAL_COMMUNITY): Payer: Self-pay | Admitting: General Surgery

## 2021-11-04 ENCOUNTER — Ambulatory Visit (HOSPITAL_COMMUNITY)
Admission: RE | Admit: 2021-11-04 | Discharge: 2021-11-04 | Disposition: A | Discharge status: Home / Self Care | Payer: BLUE CROSS/BLUE SHIELD | Attending: General Surgery | Admitting: General Surgery

## 2021-11-04 ENCOUNTER — Other Ambulatory Visit: Payer: Self-pay

## 2021-11-04 ENCOUNTER — Ambulatory Visit (HOSPITAL_COMMUNITY): Payer: BLUE CROSS/BLUE SHIELD

## 2021-11-04 ENCOUNTER — Ambulatory Visit (HOSPITAL_COMMUNITY): Payer: BLUE CROSS/BLUE SHIELD | Admitting: Anesthesiology

## 2021-11-04 DIAGNOSIS — K219 Gastro-esophageal reflux disease without esophagitis: Secondary | ICD-10-CM | POA: Insufficient documentation

## 2021-11-04 DIAGNOSIS — O9963 Diseases of the digestive system complicating the puerperium: Secondary | ICD-10-CM | POA: Insufficient documentation

## 2021-11-04 DIAGNOSIS — Z419 Encounter for procedure for purposes other than remedying health state, unspecified: Secondary | ICD-10-CM

## 2021-11-04 DIAGNOSIS — Z6831 Body mass index (BMI) 31.0-31.9, adult: Secondary | ICD-10-CM | POA: Insufficient documentation

## 2021-11-04 DIAGNOSIS — J45909 Unspecified asthma, uncomplicated: Secondary | ICD-10-CM | POA: Diagnosis not present

## 2021-11-04 DIAGNOSIS — E669 Obesity, unspecified: Secondary | ICD-10-CM | POA: Diagnosis not present

## 2021-11-04 DIAGNOSIS — O9953 Diseases of the respiratory system complicating the puerperium: Secondary | ICD-10-CM | POA: Diagnosis not present

## 2021-11-04 DIAGNOSIS — K8010 Calculus of gallbladder with chronic cholecystitis without obstruction: Secondary | ICD-10-CM | POA: Diagnosis not present

## 2021-11-04 DIAGNOSIS — O99215 Obesity complicating the puerperium: Secondary | ICD-10-CM | POA: Diagnosis not present

## 2021-11-04 HISTORY — PX: CHOLECYSTECTOMY: SHX55

## 2021-11-04 LAB — POCT PREGNANCY, URINE: Preg Test, Ur: NEGATIVE

## 2021-11-04 SURGERY — LAPAROSCOPIC CHOLECYSTECTOMY WITH INTRAOPERATIVE CHOLANGIOGRAM
Anesthesia: General | Site: Abdomen

## 2021-11-04 MED ORDER — LACTATED RINGERS IV SOLN: INTRAVENOUS | Status: DC

## 2021-11-04 MED ORDER — PROPOFOL 10 MG/ML IV BOLUS
INTRAVENOUS | Status: DC | PRN
  Administered 2021-11-04: 150 mg via INTRAVENOUS

## 2021-11-04 MED ORDER — SUGAMMADEX SODIUM 200 MG/2ML IV SOLN
INTRAVENOUS | Status: DC | PRN
Start: 2021-11-04 — End: 2021-11-04
  Administered 2021-11-04: 200 mg via INTRAVENOUS

## 2021-11-04 MED ORDER — DEXAMETHASONE SODIUM PHOSPHATE 10 MG/ML IJ SOLN
INTRAMUSCULAR | Status: DC | PRN
Start: 2021-11-04 — End: 2021-11-04
  Administered 2021-11-04: 10 mg via INTRAVENOUS

## 2021-11-04 MED ORDER — BUPIVACAINE-EPINEPHRINE (PF) 0.25% -1:200000 IJ SOLN
INTRAMUSCULAR | Status: AC
  Filled 2021-11-04: qty 30

## 2021-11-04 MED ORDER — SODIUM CHLORIDE 0.9 % IR SOLN
Status: DC | PRN
  Administered 2021-11-04: 1000 mL

## 2021-11-04 MED ORDER — FENTANYL CITRATE (PF) 100 MCG/2ML IJ SOLN
25.0000 ug | INTRAMUSCULAR | Status: DC | PRN
  Administered 2021-11-04: 50 ug via INTRAVENOUS

## 2021-11-04 MED ORDER — CHLORHEXIDINE GLUCONATE CLOTH 2 % EX PADS: 6.0000 | MEDICATED_PAD | Freq: Once | CUTANEOUS | Status: DC

## 2021-11-04 MED ORDER — CEFAZOLIN SODIUM-DEXTROSE 2-4 GM/100ML-% IV SOLN
2.0000 g | INTRAVENOUS | Status: AC
  Administered 2021-11-04: 2 g via INTRAVENOUS
  Filled 2021-11-04: qty 100

## 2021-11-04 MED ORDER — 0.9 % SODIUM CHLORIDE (POUR BTL) OPTIME
TOPICAL | Status: DC | PRN
  Administered 2021-11-04: 1000 mL

## 2021-11-04 MED ORDER — ONDANSETRON HCL 4 MG/2ML IJ SOLN
INTRAMUSCULAR | Status: AC
  Filled 2021-11-04: qty 2

## 2021-11-04 MED ORDER — ORAL CARE MOUTH RINSE: 15.0000 mL | Freq: Once | OROMUCOSAL | Status: AC

## 2021-11-04 MED ORDER — GABAPENTIN 300 MG PO CAPS
300.0000 mg | ORAL_CAPSULE | ORAL | Status: AC
  Administered 2021-11-04: 300 mg via ORAL
  Filled 2021-11-04: qty 1

## 2021-11-04 MED ORDER — MIDAZOLAM HCL 2 MG/2ML IJ SOLN
INTRAMUSCULAR | Status: AC
  Filled 2021-11-04: qty 2

## 2021-11-04 MED ORDER — ACETAMINOPHEN 500 MG PO TABS
1000.0000 mg | ORAL_TABLET | ORAL | Status: AC
  Administered 2021-11-04: 1000 mg via ORAL
  Filled 2021-11-04: qty 2

## 2021-11-04 MED ORDER — LIDOCAINE 2% (20 MG/ML) 5 ML SYRINGE
INTRAMUSCULAR | Status: DC | PRN
  Administered 2021-11-04: 80 mg via INTRAVENOUS

## 2021-11-04 MED ORDER — ROCURONIUM BROMIDE 10 MG/ML (PF) SYRINGE
PREFILLED_SYRINGE | INTRAVENOUS | Status: AC
  Filled 2021-11-04: qty 10

## 2021-11-04 MED ORDER — FENTANYL CITRATE (PF) 100 MCG/2ML IJ SOLN
INTRAMUSCULAR | Status: DC | PRN
  Administered 2021-11-04 (×3): 50 ug via INTRAVENOUS

## 2021-11-04 MED ORDER — PROPOFOL 10 MG/ML IV BOLUS
INTRAVENOUS | Status: AC
  Filled 2021-11-04: qty 20

## 2021-11-04 MED ORDER — SODIUM CHLORIDE (PF) 0.9 % IJ SOLN
INTRAVENOUS | Status: DC | PRN
  Administered 2021-11-04: 10 mL

## 2021-11-04 MED ORDER — HYDROCODONE-ACETAMINOPHEN 5-325 MG PO TABS: 1.0000 | ORAL_TABLET | Freq: Four times a day (QID) | ORAL | 0 refills | Status: DC | PRN

## 2021-11-04 MED ORDER — DIPHENHYDRAMINE HCL 50 MG/ML IJ SOLN
INTRAMUSCULAR | Status: AC
  Filled 2021-11-04: qty 1

## 2021-11-04 MED ORDER — ROCURONIUM BROMIDE 10 MG/ML (PF) SYRINGE
PREFILLED_SYRINGE | INTRAVENOUS | Status: DC | PRN
  Administered 2021-11-04: 50 mg via INTRAVENOUS

## 2021-11-04 MED ORDER — LIDOCAINE 2% (20 MG/ML) 5 ML SYRINGE
INTRAMUSCULAR | Status: AC
  Filled 2021-11-04: qty 5

## 2021-11-04 MED ORDER — ONDANSETRON HCL 4 MG/2ML IJ SOLN
INTRAMUSCULAR | Status: DC | PRN
  Administered 2021-11-04: 4 mg via INTRAVENOUS

## 2021-11-04 MED ORDER — FENTANYL CITRATE (PF) 100 MCG/2ML IJ SOLN
INTRAMUSCULAR | Status: AC
  Filled 2021-11-04: qty 2

## 2021-11-04 MED ORDER — DEXAMETHASONE SODIUM PHOSPHATE 10 MG/ML IJ SOLN
INTRAMUSCULAR | Status: AC
  Filled 2021-11-04: qty 1

## 2021-11-04 MED ORDER — BUPIVACAINE-EPINEPHRINE 0.25% -1:200000 IJ SOLN
INTRAMUSCULAR | Status: DC | PRN
  Administered 2021-11-04: 25 mL

## 2021-11-04 MED ORDER — DIPHENHYDRAMINE HCL 50 MG/ML IJ SOLN
INTRAMUSCULAR | Status: DC | PRN
  Administered 2021-11-04: 12.5 mg via INTRAVENOUS

## 2021-11-04 MED ORDER — FENTANYL CITRATE (PF) 250 MCG/5ML IJ SOLN
INTRAMUSCULAR | Status: AC
  Filled 2021-11-04: qty 5

## 2021-11-04 MED ORDER — PROMETHAZINE HCL 25 MG/ML IJ SOLN: 6.2500 mg | INTRAMUSCULAR | Status: DC | PRN

## 2021-11-04 MED ORDER — CELECOXIB 200 MG PO CAPS
200.0000 mg | ORAL_CAPSULE | ORAL | Status: AC
  Administered 2021-11-04: 200 mg via ORAL
  Filled 2021-11-04: qty 1

## 2021-11-04 MED ORDER — SCOPOLAMINE 1 MG/3DAYS TD PT72
1.0000 | MEDICATED_PATCH | Freq: Once | TRANSDERMAL | Status: DC
  Administered 2021-11-04: 1.5 mg via TRANSDERMAL
  Filled 2021-11-04: qty 1

## 2021-11-04 MED ORDER — CHLORHEXIDINE GLUCONATE 0.12 % MT SOLN
15.0000 mL | Freq: Once | OROMUCOSAL | Status: AC
  Administered 2021-11-04: 15 mL via OROMUCOSAL
  Filled 2021-11-04: qty 15

## 2021-11-04 SURGICAL SUPPLY — 36 items
APPLIER CLIP 5 13 M/L LIGAMAX5 (MISCELLANEOUS) ×3
BAG COUNTER SPONGE SURGICOUNT (BAG) ×2 IMPLANT
BAG SURGICOUNT SPONGE COUNTING (BAG) ×1
BLADE CLIPPER SURG (BLADE) IMPLANT
CANISTER SUCT 3000ML PPV (MISCELLANEOUS) ×3 IMPLANT
CATH REDDICK CHOLANGI 4FR 50CM (CATHETERS) ×3 IMPLANT
CHLORAPREP W/TINT 26 (MISCELLANEOUS) ×3 IMPLANT
CLIP APPLIE 5 13 M/L LIGAMAX5 (MISCELLANEOUS) ×1 IMPLANT
COVER MAYO STAND STRL (DRAPES) ×3 IMPLANT
COVER SURGICAL LIGHT HANDLE (MISCELLANEOUS) ×3 IMPLANT
DERMABOND ADVANCED (GAUZE/BANDAGES/DRESSINGS) ×2
DERMABOND ADVANCED .7 DNX12 (GAUZE/BANDAGES/DRESSINGS) ×1 IMPLANT
DRAPE C-ARM 42X120 X-RAY (DRAPES) ×3 IMPLANT
ELECT REM PT RETURN 9FT ADLT (ELECTROSURGICAL) ×3
ELECTRODE REM PT RTRN 9FT ADLT (ELECTROSURGICAL) ×1 IMPLANT
GLOVE SURG ENC MOIS LTX SZ7.5 (GLOVE) ×3 IMPLANT
GOWN STRL REUS W/ TWL LRG LVL3 (GOWN DISPOSABLE) ×3 IMPLANT
GOWN STRL REUS W/TWL LRG LVL3 (GOWN DISPOSABLE) ×6
IV CATH 14GX2 1/4 (CATHETERS) ×3 IMPLANT
KIT BASIN OR (CUSTOM PROCEDURE TRAY) ×3 IMPLANT
KIT TURNOVER KIT B (KITS) ×3 IMPLANT
NS IRRIG 1000ML POUR BTL (IV SOLUTION) ×3 IMPLANT
PAD ARMBOARD 7.5X6 YLW CONV (MISCELLANEOUS) ×3 IMPLANT
POUCH SPECIMEN RETRIEVAL 10MM (ENDOMECHANICALS) ×3 IMPLANT
SCISSORS LAP 5X35 DISP (ENDOMECHANICALS) ×3 IMPLANT
SET IRRIG TUBING LAPAROSCOPIC (IRRIGATION / IRRIGATOR) ×3 IMPLANT
SET TUBE SMOKE EVAC HIGH FLOW (TUBING) ×3 IMPLANT
SLEEVE ENDOPATH XCEL 5M (ENDOMECHANICALS) ×6 IMPLANT
SPECIMEN JAR SMALL (MISCELLANEOUS) ×3 IMPLANT
SUT MNCRL AB 4-0 PS2 18 (SUTURE) ×3 IMPLANT
TOWEL GREEN STERILE (TOWEL DISPOSABLE) ×3 IMPLANT
TOWEL GREEN STERILE FF (TOWEL DISPOSABLE) ×3 IMPLANT
TRAY LAPAROSCOPIC MC (CUSTOM PROCEDURE TRAY) ×3 IMPLANT
TROCAR XCEL BLUNT TIP 100MML (ENDOMECHANICALS) ×3 IMPLANT
TROCAR XCEL NON-BLD 5MMX100MML (ENDOMECHANICALS) ×3 IMPLANT
WATER STERILE IRR 1000ML POUR (IV SOLUTION) ×3 IMPLANT

## 2021-11-04 NOTE — H&P (Signed)
MRN: [id number] DOB: [birth year] Subjective  Chief Complaint: No chief complaint on file.   History of Present Illness: Karen Hatfield is a 30 y.o. female who is seen today as an office consultation at the request of Dr. Randell Patient for evaluation of No chief complaint on file. .   We are asked to see the patient in consultation by Dr. Alvira Monday to evaluate her for gallstones. The patient is a 30 year old black female who has been experiencing epigastric abdominal pain throughout her pregnancy. She just delivered her baby about a week ago and everyone is healthy. The pain was occurring every couple weeks. The pain was associated with significant nausea and vomiting. During the end of the pregnancy she went for an ultrasound which did show that she had stones in the gallbladder but no gallbladder wall thickening. Her liver functions were essentially normal.  Review of Systems: A complete review of systems was obtained from the patient. I have reviewed this information and discussed as appropriate with the patient. See HPI as well for other ROS.  ROS   Medical History: Past Medical History:  Diagnosis Date   Anemia   Asthma, unspecified asthma severity, unspecified whether complicated, unspecified whether persistent   Patient Active Problem List  Diagnosis   Calculus of gallbladder without cholecystitis without obstruction   History reviewed. No pertinent surgical history.   No Known Allergies  Current Outpatient Medications on File Prior to Visit  Medication Sig Dispense Refill   acetaminophen (TYLENOL) 500 mg capsule Take by mouth   prenatal vit 87-iron-folic-dha (PRENATE MINI, FERR ASP GLYCIN,) 18-1-350 mg Cap daily   No current facility-administered medications on file prior to visit.   Family History  Problem Relation Age of Onset   Colon cancer Mother    Social History   Tobacco Use  Smoking Status Never Smoker  Smokeless Tobacco Never Used    Social History    Socioeconomic History   Marital status: Married  Tobacco Use   Smoking status: Never Smoker   Smokeless tobacco: Never Used  Substance and Sexual Activity   Alcohol use: Yes   Drug use: Never   Objective:  There were no vitals filed for this visit.  There is no height or weight on file to calculate BMI.  Physical Exam Vitals reviewed.  Constitutional:  General: She is not in acute distress. Appearance: Normal appearance.  HENT:  Head: Normocephalic and atraumatic.  Right Ear: External ear normal.  Left Ear: External ear normal.  Nose: Nose normal.  Mouth/Throat:  Mouth: Mucous membranes are moist.  Pharynx: Oropharynx is clear.  Eyes:  General: No scleral icterus. Extraocular Movements: Extraocular movements intact.  Conjunctiva/sclera: Conjunctivae normal.  Pupils: Pupils are equal, round, and reactive to light.  Cardiovascular:  Rate and Rhythm: Normal rate and regular rhythm.  Pulses: Normal pulses.  Heart sounds: Normal heart sounds.  Pulmonary:  Effort: Pulmonary effort is normal. No respiratory distress.  Breath sounds: Normal breath sounds.  Abdominal:  General: Bowel sounds are normal.  Palpations: Abdomen is soft. There is no mass.  Tenderness: There is no abdominal tenderness.  Musculoskeletal:  General: No swelling, tenderness or deformity. Normal range of motion.  Cervical back: Normal range of motion and neck supple.  Skin: General: Skin is warm and dry.  Coloration: Skin is not jaundiced.  Neurological:  General: No focal deficit present.  Mental Status: She is alert and oriented to person, place, and time.  Psychiatric:  Mood and Affect: Mood normal.  Behavior: Behavior normal.  Labs, Imaging and Diagnostic Testing:  Assessment and Plan:  Diagnoses and all orders for this visit:  Calculus of gallbladder without cholecystitis without obstruction - [facility] Case Posting Request; Future    The patient appears to have symptomatic  gallstones. Because of the risk of further painful episodes and possible pancreatitis I feel she would benefit from having her gallbladder removed. She would also like to have this done. I have discussed with her in detail the risks and benefits of the operation as well as some of the technical aspects including the risk of common bile duct injury and she understands and wishes to proceed. She is a good candidate for laparoscopic cholecystectomy with intraoperative cholangiogram.

## 2021-11-04 NOTE — Anesthesia Procedure Notes (Signed)

## 2021-11-04 NOTE — Op Note (Signed)
11/04/2021  11:24 AM  PATIENT:  Karen Hatfield  30 y.o. female  PRE-OPERATIVE DIAGNOSIS:  GALLSTONES  POST-OPERATIVE DIAGNOSIS:  GALLSTONES  PROCEDURE:  Procedure(s): LAPAROSCOPIC CHOLECYSTECTOMY WITH INTRAOPERATIVE CHOLANGIOGRAM (N/A)  SURGEON:  Surgeon(s) and Role:    * Griselda Miner, MD - Primary  PHYSICIAN ASSISTANT:   ASSISTANTS: none   ANESTHESIA:   local and general  EBL:  3 mL   BLOOD ADMINISTERED:none  DRAINS: none   LOCAL MEDICATIONS USED:  MARCAINE     SPECIMEN:  Source of Specimen:  gallbladder  DISPOSITION OF SPECIMEN:  PATHOLOGY  COUNTS:  YES  TOURNIQUET:  * No tourniquets in log *  DICTATION: .Dragon Dictation    Procedure: After informed consent was obtained the patient was brought to the operating room and placed in the supine position on the operating room table. After adequate induction of general anesthesia the patient's abdomen was prepped with ChloraPrep allowed to dry and draped in usual sterile manner. An appropriate timeout was performed. The area below the umbilicus was infiltrated with quarter percent  Marcaine. A small incision was made with a 15 blade knife. The incision was carried down through the subcutaneous tissue bluntly with a hemostat and [employer] retractors. The linea alba was identified. The linea alba was incised with a 15 blade knife and each side was grasped with Coker clamps. The preperitoneal space was then probed with a hemostat until the peritoneum was opened and access was gained to the abdominal cavity. A 0 Vicryl pursestring stitch was placed in the fascia surrounding the opening. A Hassan cannula was then placed through the opening and anchored in place with the previously placed Vicryl purse string stitch. The abdomen was insufflated with carbon dioxide without difficulty. A laparoscope was inserted through the [facility] cannula in the right upper quadrant was inspected. Next the epigastric region was infiltrated with %  Marcaine. A small incision was made with a 15 blade knife. A 5 mm port was placed bluntly through this incision into the abdominal cavity under direct vision. Next 2 sites were chosen laterally on the right side of the abdomen for placement of 5 mm ports. Each of these areas was infiltrated with quarter percent Marcaine. Small stab incisions were made with a 15 blade knife. 5 mm ports were then placed bluntly through these incisions into the abdominal cavity under direct vision without difficulty. A blunt grasper was placed through the lateralmost 5 mm port and used to grasp the dome of the gallbladder and elevated anteriorly and superiorly. Another blunt grasper was placed through the other 5 mm port and used to retract the body and neck of the gallbladder. A dissector was placed through the epigastric port and using the electrocautery the peritoneal reflection at the gallbladder neck was opened. Blunt dissection was then carried out in this area until the gallbladder neck-cystic duct junction was readily identified and a good window was created. A single clip was placed on the gallbladder neck. A small  ductotomy was made just below the clip with laparoscopic scissors. A 14-gauge Angiocath was then placed through the anterior abdominal wall under direct vision. A Reddick cholangiogram catheter was then placed through the Angiocath and flushed. The catheter was then placed in the cystic duct and anchored in place with a clip. A cholangiogram was obtained that showed no filling defects good emptying into the duodenum an adequate length on the cystic duct. The anchoring clip and catheters were then removed from the patient. 3 clips were placed  proximally on the cystic duct and the duct was divided between the 2 sets of clips. Posterior to this the cystic artery was identified and again dissected bluntly in a circumferential manner until a good window  was created. 2 clips were placed proximally and one distally on  the artery and the artery was divided between the 2 sets of clips. Next a laparoscopic hook cautery device was used to separate the gallbladder from the liver bed. Prior to completely detaching the gallbladder from the liver bed the liver bed was inspected and several small bleeding points were coagulated with the electrocautery until the area was completely hemostatic. The gallbladder was then detached the rest of it from the liver bed without difficulty. A laparoscopic bag was inserted through the hassan port. The laparoscope was moved to the epigastric port. The gallbladder was placed within the bag and the bag was sealed.  The bag with the gallbladder was then removed with the [facility] cannula through the infraumbilical port without difficulty. The fascial defect was then closed with the previously placed Vicryl pursestring stitch as well as with another figure-of-eight 0 Vicryl stitch. The liver bed was inspected again and found to be hemostatic. The abdomen was irrigated with copious amounts of saline until the effluent was clear. The ports were then removed under direct vision without difficulty and were found to be hemostatic. The gas was allowed to escape. The skin incisions were all closed with interrupted 4-0 Monocryl subcuticular stitches. Dermabond dressings were applied. The patient tolerated the procedure well. At the end of the case all needle sponge and instrument counts were correct. The patient was then awakened and taken to recovery in stable condition   PLAN OF CARE: Discharge to home after PACU  PATIENT DISPOSITION:  PACU - hemodynamically stable.   Delay start of Pharmacological VTE agent (>24hrs) due to surgical blood loss or risk of bleeding: not applicable

## 2021-11-04 NOTE — Interval H&P Note (Signed)
History and Physical Interval Note:  11/04/2021 10:00 AM  Karen Hatfield  has presented today for surgery, with the diagnosis of GALLSTONES.  The various methods of treatment have been discussed with the patient and family. After consideration of risks, benefits and other options for treatment, the patient has consented to  Procedure(s): LAPAROSCOPIC CHOLECYSTECTOMY WITH INTRAOPERATIVE CHOLANGIOGRAM (N/A) as a surgical intervention.  The patient's history has been reviewed, patient examined, no change in status, stable for surgery.  I have reviewed the patient's chart and labs.  Questions were answered to the patient's satisfaction.     Chevis Pretty III

## 2021-11-04 NOTE — Anesthesia Postprocedure Evaluation (Signed)
Anesthesia Post Note  Patient: Karen Hatfield  Procedure(s) Performed: LAPAROSCOPIC CHOLECYSTECTOMY WITH INTRAOPERATIVE CHOLANGIOGRAM (Abdomen)     Patient location during evaluation: PACU Anesthesia Type: General Level of consciousness: awake and alert Pain management: pain level controlled Vital Signs Assessment: post-procedure vital signs reviewed and stable Respiratory status: spontaneous breathing, nonlabored ventilation, respiratory function stable and patient connected to nasal cannula oxygen Cardiovascular status: blood pressure returned to baseline and stable Postop Assessment: no apparent nausea or vomiting Anesthetic complications: no   No notable events documented.  Last Vitals:  Vitals:   11/04/21 1205 11/04/21 1220  BP: 129/87 132/82  Pulse: 70 62  Resp: 14 11  Temp:    SpO2: 100% 98%    Last Pain:  Vitals:   11/04/21 1205  TempSrc:   PainSc: Asleep                 Cecile Hearing

## 2021-11-04 NOTE — Anesthesia Preprocedure Evaluation (Addendum)
Anesthesia Evaluation  Patient identified by MRN, date of birth, ID band Patient awake    Reviewed: Allergy & Precautions, NPO status , Patient's Chart, lab work & pertinent test results  History of Anesthesia Complications Negative for: history of anesthetic complications  Airway Mallampati: II  TM Distance: >3 FB Neck ROM: Full    Dental  (+) Teeth Intact, Dental Advisory Given   Pulmonary asthma ,    Pulmonary exam normal breath sounds clear to auscultation       Cardiovascular Exercise Tolerance: Good negative cardio ROS Normal cardiovascular exam Rhythm:Regular Rate:Normal     Neuro/Psych  Headaches,  Neuromuscular disease    GI/Hepatic GERD  Medicated,GALLSTONES   Endo/Other  Obesity   Renal/GU negative Renal ROS     Musculoskeletal negative musculoskeletal ROS (+)   Abdominal   Peds  Hematology negative hematology ROS (+)   Anesthesia Other Findings Day of surgery medications reviewed with the patient.  Reproductive/Obstetrics BREAST FEEDING                            Anesthesia Physical Anesthesia Plan  ASA: 2  Anesthesia Plan: General   Post-op Pain Management:    Induction: Intravenous  PONV Risk Score and Plan: 4 or greater and Dexamethasone, Ondansetron and Diphenhydramine  Airway Management Planned: Oral ETT  Additional Equipment:   Intra-op Plan:   Post-operative Plan: Extubation in OR  Informed Consent: I have reviewed the patients History and Physical, chart, labs and discussed the procedure including the risks, benefits and alternatives for the proposed anesthesia with the patient or authorized representative who has indicated his/her understanding and acceptance.     Dental advisory given  Plan Discussed with: CRNA  Anesthesia Plan Comments:        Anesthesia Quick Evaluation

## 2021-11-04 NOTE — Transfer of Care (Signed)
Immediate Anesthesia Transfer of Care Note  Patient: Karen Hatfield  Procedure(s) Performed: LAPAROSCOPIC CHOLECYSTECTOMY WITH INTRAOPERATIVE CHOLANGIOGRAM (Abdomen)  Patient Location: PACU  Anesthesia Type:General  Level of Consciousness: awake, alert  and oriented  Airway & Oxygen Therapy: Patient Spontanous Breathing  Post-op Assessment: Report given to RN  Post vital signs: Reviewed and stable  Last Vitals:  Vitals Value Taken Time  BP 131/83 11/04/21 1135  Temp    Pulse 89 11/04/21 1136  Resp 13 11/04/21 1136  SpO2 99 % 11/04/21 1136  Vitals shown include unvalidated device data.  Last Pain:  Vitals:   11/04/21 0909  TempSrc:   PainSc: 5          Complications: No notable events documented.

## 2021-11-05 ENCOUNTER — Encounter (HOSPITAL_COMMUNITY): Payer: Self-pay | Admitting: General Surgery

## 2021-11-05 LAB — SURGICAL PATHOLOGY

## 2022-07-12 LAB — GLUCOSE, POCT (MANUAL RESULT ENTRY): POC Glucose: 108 mg/dl — AB (ref 70–99)

## 2022-09-26 ENCOUNTER — Other Ambulatory Visit (HOSPITAL_BASED_OUTPATIENT_CLINIC_OR_DEPARTMENT_OTHER): Payer: Self-pay

## 2022-09-26 ENCOUNTER — Emergency Department (HOSPITAL_BASED_OUTPATIENT_CLINIC_OR_DEPARTMENT_OTHER)
Admission: EM | Admit: 2022-09-26 | Discharge: 2022-09-26 | Disposition: A | Discharge status: Home / Self Care | Payer: Medicaid Other | Attending: Emergency Medicine | Admitting: Emergency Medicine

## 2022-09-26 ENCOUNTER — Other Ambulatory Visit: Payer: Self-pay

## 2022-09-26 ENCOUNTER — Encounter (HOSPITAL_BASED_OUTPATIENT_CLINIC_OR_DEPARTMENT_OTHER): Payer: Self-pay

## 2022-09-26 DIAGNOSIS — N898 Other specified noninflammatory disorders of vagina: Secondary | ICD-10-CM | POA: Insufficient documentation

## 2022-09-26 DIAGNOSIS — N739 Female pelvic inflammatory disease, unspecified: Secondary | ICD-10-CM

## 2022-09-26 LAB — WET PREP, GENITAL
Sperm: NONE SEEN
Trich, Wet Prep: NONE SEEN
WBC, Wet Prep HPF POC: >=10 — AB (ref <10)
Yeast Wet Prep HPF POC: NONE SEEN

## 2022-09-26 LAB — URINALYSIS, ROUTINE W REFLEX MICROSCOPIC
Bilirubin Urine: NEGATIVE
Glucose, UA: NEGATIVE mg/dL
Ketones, ur: NEGATIVE mg/dL
Nitrite: NEGATIVE
Protein, ur: 30 mg/dL — AB
RBC / HPF: >50 RBC/hpf — ABNORMAL HIGH (ref 0–5)
Specific Gravity, Urine: 1.021 (ref 1.005–1.030)
WBC, UA: >50 WBC/hpf — ABNORMAL HIGH (ref 0–5)
pH: 5.5 (ref 5.0–8.0)

## 2022-09-26 LAB — PREGNANCY, URINE: Preg Test, Ur: NEGATIVE

## 2022-09-26 IMAGING — US US ABDOMEN LIMITED
1 series · 15 of 25 positions shown · non-contrast
Comparison: None.

CLINICAL DATA: Upper abdominal pain, pregnant

EXAM:
ULTRASOUND ABDOMEN LIMITED RIGHT UPPER QUADRANT

[Series 1: us abdomen limited · 15 of 36 slices shown]
[im 1/36]
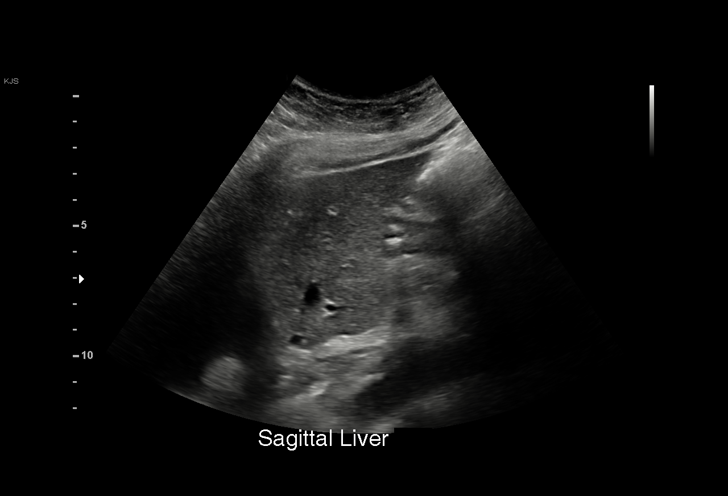
[im 3/36]
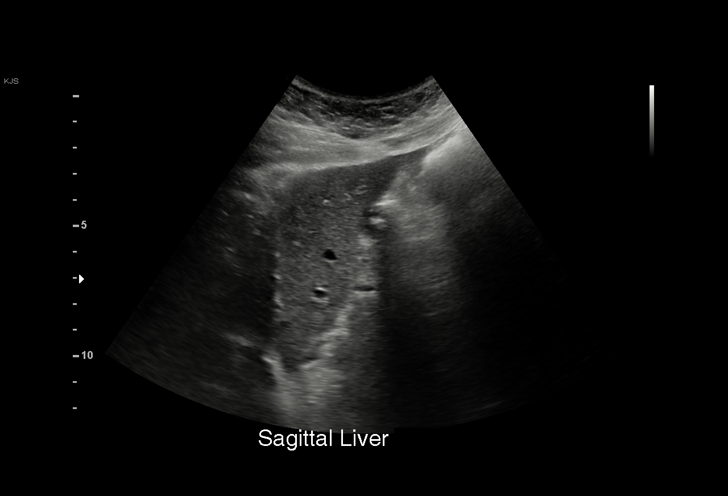
[im 6/36]
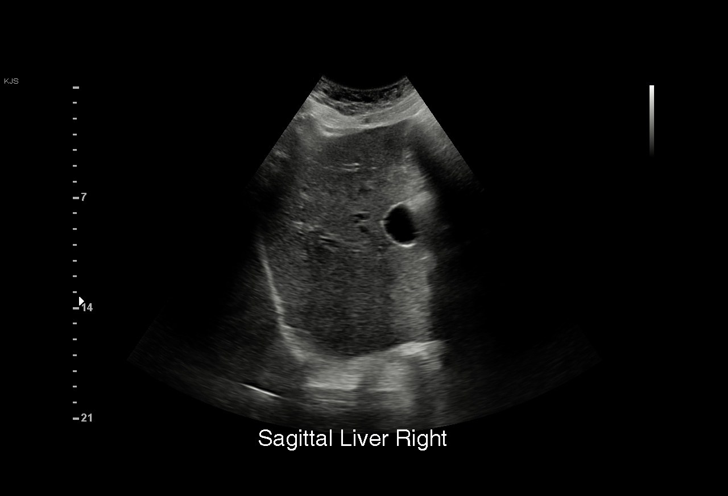
[im 8/36]
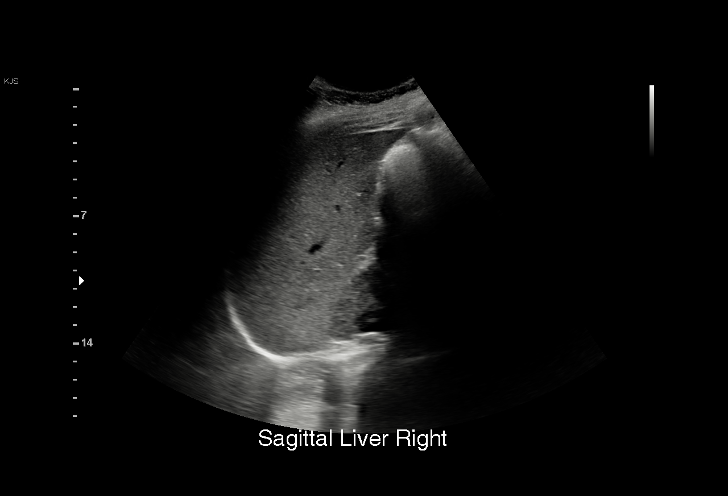
[im 11/36]
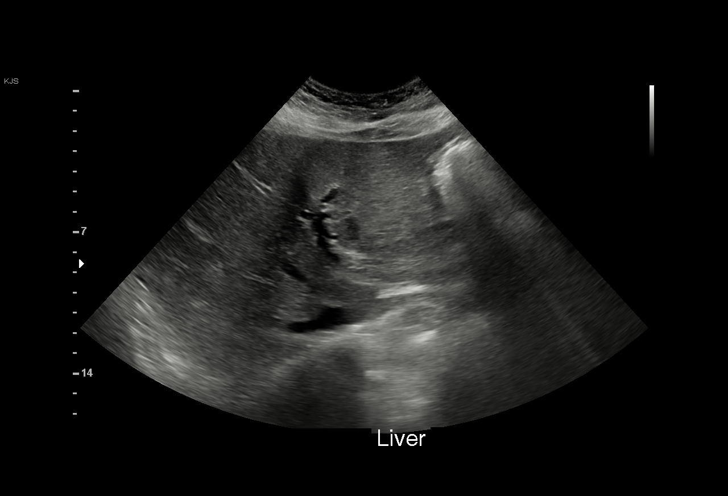
[im 14/36]
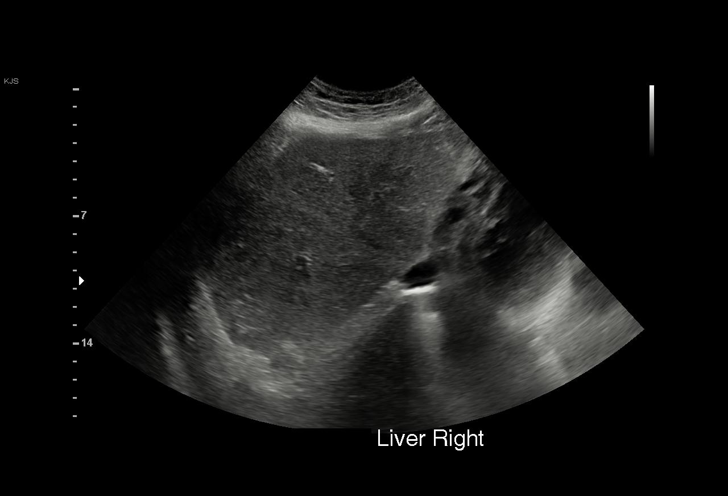
[im 15/36]
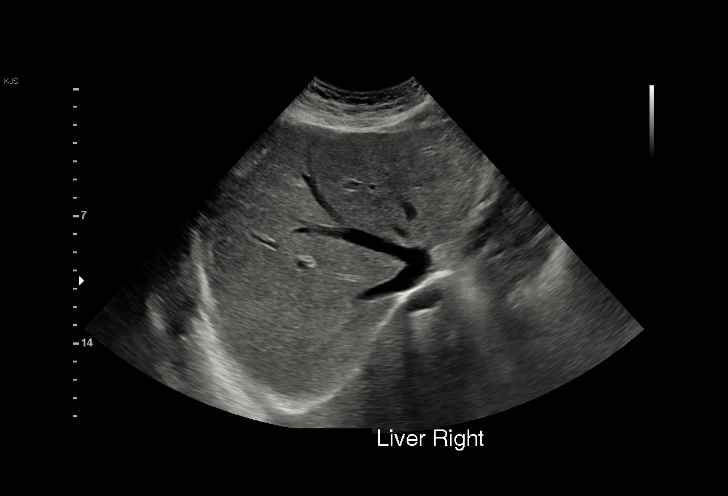
[im 18/36]
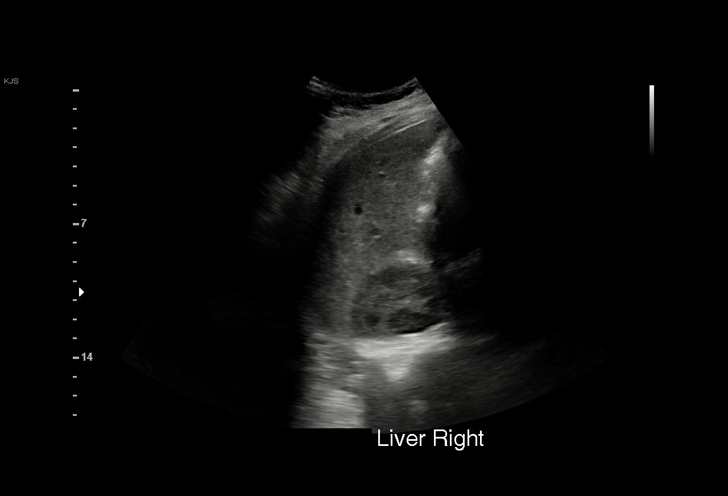
[im 21/36]
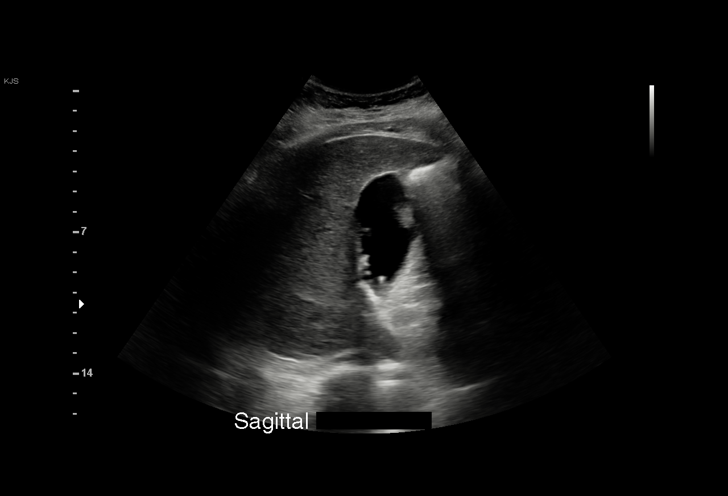
[im 22/36]
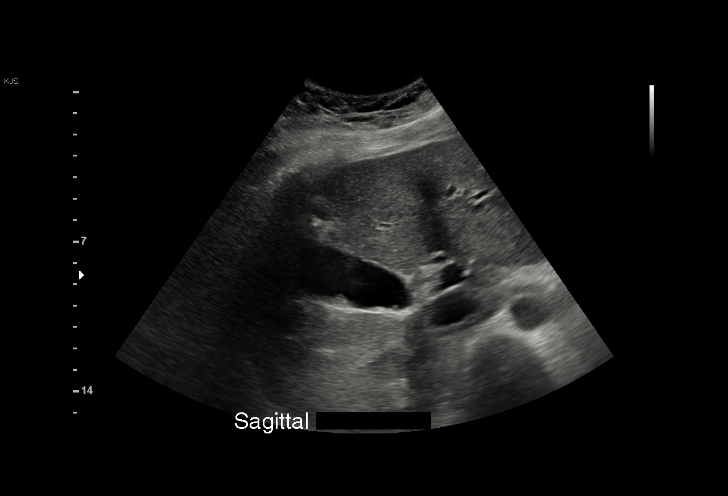
[im 25/36]
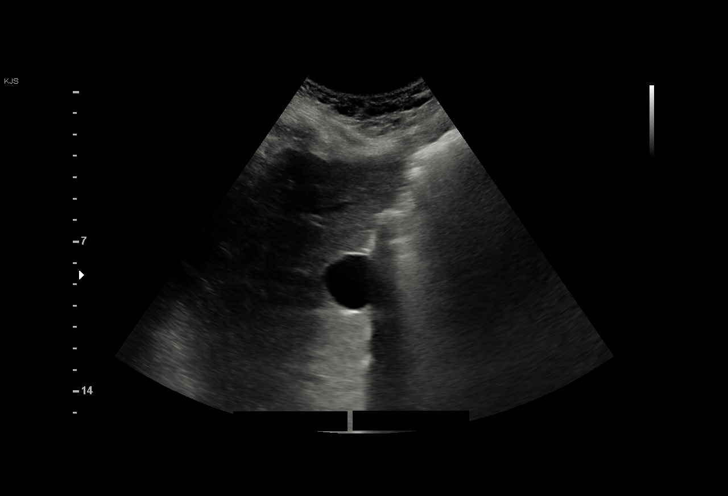
[im 28/36]
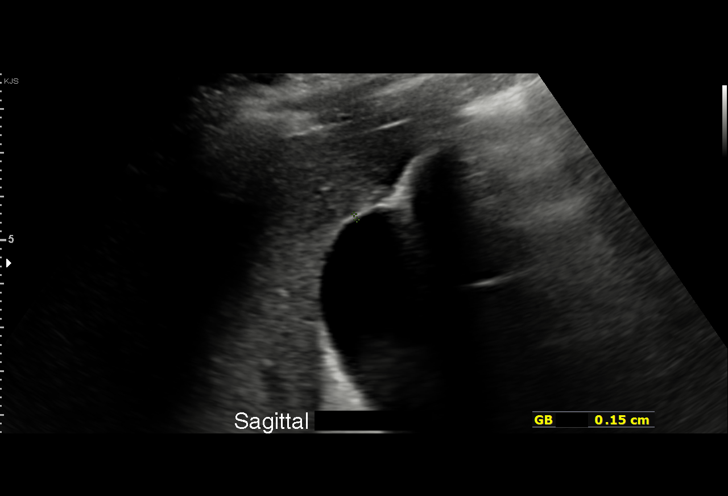
[im 30/36]
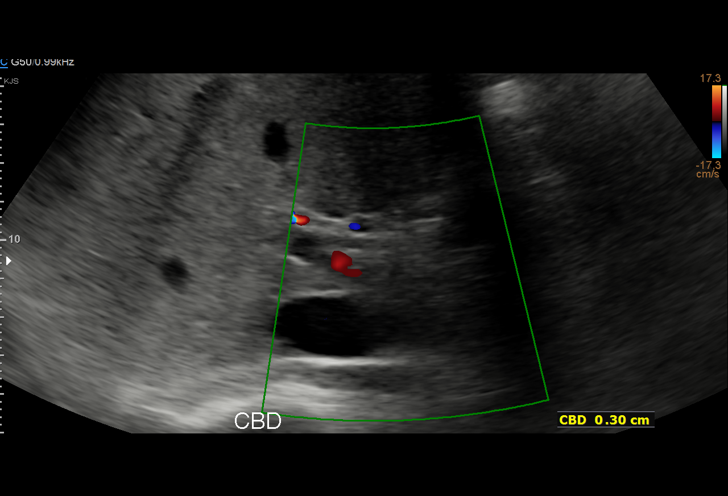
[im 33/36]
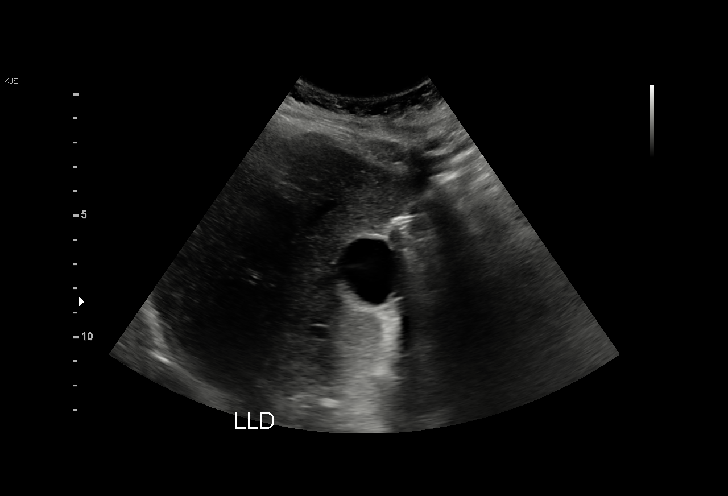
[im 36/36]
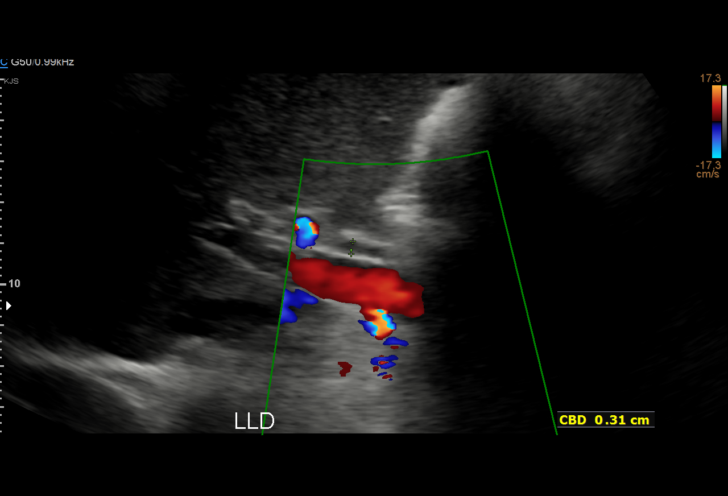

[15 of 25 positions shown; findings below may reference images not displayed]

FINDINGS: Gallbladder:

Tiny mobile gallstones. No gallbladder wall thickening. Positive
sonographic Murphy sign noted by sonographer.

Common bile duct:

Diameter: 5 mm

Liver:

No focal lesion identified. Within normal limits in parenchymal
echogenicity. Portal vein is patent on color Doppler imaging with
normal direction of blood flow towards the liver.

Other: None.
IMPRESSION: Tiny mobile gallstones. Positive sonographic Murphy sign noted by
sonographer, however there is no gallbladder wall thickening,
pericholecystic fluid, or biliary ductal dilatation. Findings are
equivocal for acute cholecystitis.

These results will be called to the ordering clinician or
representative by the Radiologist Assistant, and communication
documented in the PACS or [REDACTED].

## 2022-09-26 MED ORDER — METRONIDAZOLE 500 MG PO TABS
500.0000 mg | ORAL_TABLET | Freq: Once | ORAL | Status: AC
  Administered 2022-09-26: 500 mg via ORAL
  Filled 2022-09-26: qty 1

## 2022-09-26 MED ORDER — CEFTRIAXONE SODIUM 500 MG IJ SOLR
500.0000 mg | Freq: Once | INTRAMUSCULAR | Status: AC
  Administered 2022-09-26: 500 mg via INTRAMUSCULAR
  Filled 2022-09-26: qty 500

## 2022-09-26 MED ORDER — DOXYCYCLINE HYCLATE 100 MG PO TABS
100.0000 mg | ORAL_TABLET | Freq: Once | ORAL | Status: AC
  Administered 2022-09-26: 100 mg via ORAL
  Filled 2022-09-26: qty 1

## 2022-09-26 MED ORDER — METRONIDAZOLE 500 MG PO TABS: 500.0000 mg | ORAL_TABLET | Freq: Two times a day (BID) | ORAL | 0 refills | Status: AC

## 2022-09-26 MED ORDER — DOXYCYCLINE HYCLATE 100 MG PO CAPS: 100.0000 mg | ORAL_CAPSULE | Freq: Two times a day (BID) | ORAL | 0 refills | Status: AC

## 2022-09-26 NOTE — ED Triage Notes (Signed)
Pt presents POV from home, pt states she thinks her IUD has moved, she feels like it is poking her, states the strings from her IUD feel lower, symptoms ongoing 9/22

## 2022-09-26 NOTE — Discharge Instructions (Addendum)
I am concerned that there is an infection near your IUD.  You are being treated with multiple antibiotics to cover the different bacteria in that area.   If you develop abdominal pain, new or worsening symptoms, fever, or any other new/concerning symptoms then return to the ER for evaluation.  Otherwise keep your follow-up appointment for Monday with the OB/GYN.

## 2022-09-26 NOTE — ED Provider Notes (Signed)
[facility] EMERGENCY DEPT Provider Note   CSN: [id number] Arrival date & time: 09/26/22  1110     History  Chief Complaint  Patient presents with   Contraception    Karen Hatfield is a 31 y.o. female.  HPI 31 year old female presents with concern that her IUD has shifted.  She states that she has been noticing some pain in her vagina when she urinates for about 1 week.  She thought it was related to her menstrual cycle.  However this morning she is feeling more discomfort in her vagina and feels like the strings are lower than they usually are.  Still has the discomfort when urinating.  No abdominal pain.  Home Medications Prior to Admission medications   Medication Sig Start Date End Date Taking? Authorizing Provider  acetaminophen (TYLENOL) 500 MG tablet Take 500-1,000 mg by mouth every 6 (six) hours as needed for moderate pain.    [provider]  albuterol (PROVENTIL HFA;VENTOLIN HFA) 108 (90 BASE) MCG/ACT inhaler Inhale 2 puffs into the lungs every 6 (six) hours as needed for wheezing or shortness of breath. 11/08/14   Saguier, Ramon Dredge, PA-C  beclomethasone (QVAR) 40 MCG/ACT inhaler Inhale 2 puffs into the lungs 2 (two) times daily at 10 AM and 5 PM. Patient not taking: No sig reported 11/08/14   Saguier, Ramon Dredge, PA-C  famotidine (PEPCID) 20 MG tablet Take 1 tablet (20 mg total) by mouth 2 (two) times daily. Patient not taking: No sig reported 05/16/21 05/16/22  Leftwich-Kirby, Wilmer Floor, CNM  ferrous sulfate 325 (65 FE) MG tablet Take 1 tablet (325 mg total) by mouth every other day. 08/20/21 08/20/22  Nugent, Odie Sera, NP  HYDROcodone-acetaminophen (NORCO/VICODIN) 5-325 MG tablet Take 1 tablet by mouth every 6 (six) hours as needed for moderate pain or severe pain. 11/04/21   Chevis Pretty III, MD  Magnesium 250 MG TABS Take 250 mg by mouth daily.    [provider]  Prenat-FeCbn-FeAsp-Meth-FA-DHA (PRENATE MINI) 18-0.6-0.4-350 MG CAPS Take 1 tablet by mouth  daily.    [provider]      Allergies    Patient has no known allergies.    Review of Systems   Review of Systems  Gastrointestinal:  Negative for abdominal pain.  Genitourinary:  Positive for dysuria and vaginal pain. Negative for vaginal bleeding.    Physical Exam Updated Vital Signs BP 113/77   Pulse 65   Temp 98.3 F (36.8 C)   Resp 17   SpO2 100%  Physical Exam Vitals and nursing note reviewed. Exam conducted with a chaperone present.  Constitutional:      General: She is not in acute distress.    Appearance: She is well-developed. She is not ill-appearing or diaphoretic.  HENT:     Head: Normocephalic and atraumatic.  Pulmonary:     Effort: Pulmonary effort is normal.  Abdominal:     General: There is no distension.     Palpations: Abdomen is soft.     Tenderness: There is no abdominal tenderness.  Genitourinary:    Cervix: Discharge, friability, erythema and cervical bleeding present.     Comments: IUD strings are in place.  I do not see the IUD device itself.  There is friability and discharge and a little bit of bleeding after taking swabs on the cervix.  Generally uncomfortable during the pelvic exam. Skin:    General: Skin is warm and dry.  Neurological:     Mental Status: She is alert.  ED Results / Procedures / Treatments   Labs (all labs ordered are listed, but only abnormal results are displayed) Labs Reviewed  URINALYSIS, ROUTINE W REFLEX MICROSCOPIC  PREGNANCY, URINE    EKG None  Radiology No results found.  Procedures Procedures    Medications Ordered in ED Medications - No data to display  ED Course/ Medical Decision Making/ A&P                           Medical Decision Making Amount and/or Complexity of Data Reviewed External Data Reviewed: notes. Labs: ordered.    Details: Normal pregnancy test. Pyuria on UA  Risk Prescription drug management.   Given the purulent discharge coming from her cervix, she  either has cervicitis or developing PID from the IUD.  Of course STI could be a cause as well.  We will treat with Rocephin, doxycycline, Flagyl.  Has a benign abdominal exam.  I do not think an ultrasound or CT is warranted at this point or lab work given no systemic symptoms.  Highly doubt TOA.  Will discharge home with antibiotics and return precautions.  She already has OB/GYN follow-up on 10/2.  She is not currently breast-feeding.        Final Clinical Impression(s) / ED Diagnoses Final diagnoses:  None    Rx / DC Orders ED Discharge Orders     None         Sherwood Gambler, MD 09/26/22 [phone]

## 2022-09-29 LAB — GC/CHLAMYDIA PROBE AMP (~~LOC~~) NOT AT ARMC
Chlamydia: NEGATIVE
Comment: NEGATIVE
Comment: NORMAL
Neisseria Gonorrhea: NEGATIVE

## 2022-12-11 IMAGING — RF DG CHOLANGIOGRAM OPERATIVE
1 series · 4 of 4 positions shown · non-contrast
Comparison: Abdominal ultrasound 08/20/2021

CLINICAL DATA: 29-year-old female undergoing laparoscopic
cholecystectomy with intraoperative cholangiogram

EXAM:
INTRAOPERATIVE CHOLANGIOGRAM
TECHNIQUE: Cholangiographic images from the C-arm fluoroscopic device were
submitted for interpretation post-operatively. Please see the
procedural report for the amount of contrast and the fluoroscopy
time utilized.

[Series 1: unknown protocol · 0.20mm/px · 4 of 61 frames shown]
[frame 1/61]
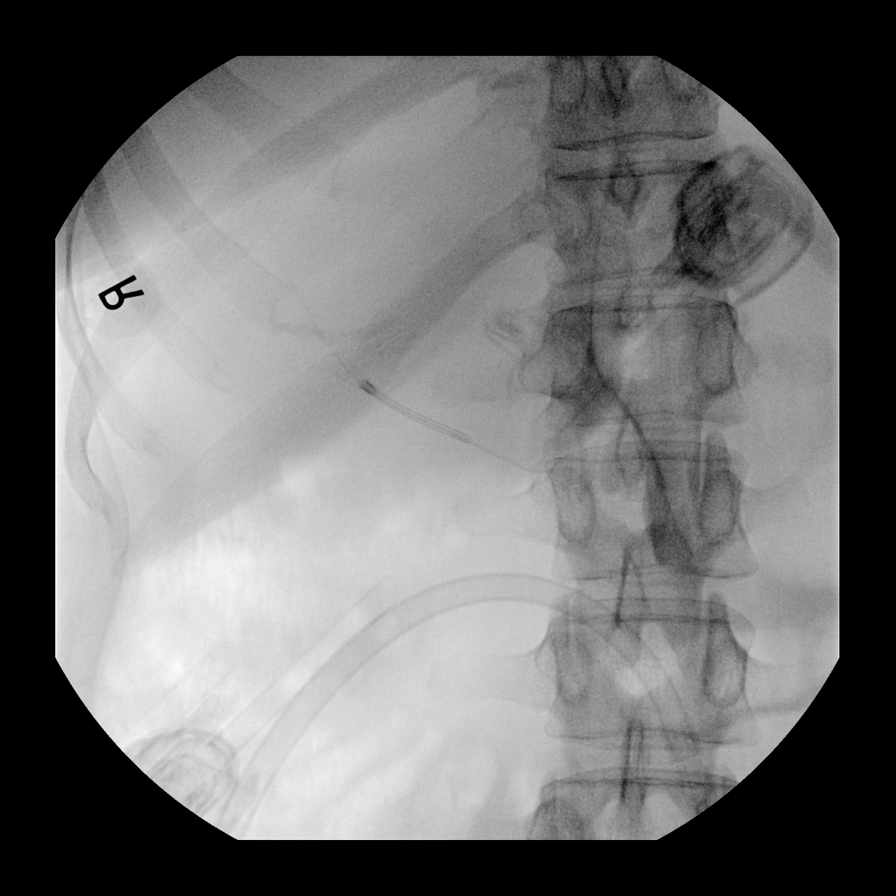
[frame 10/61]
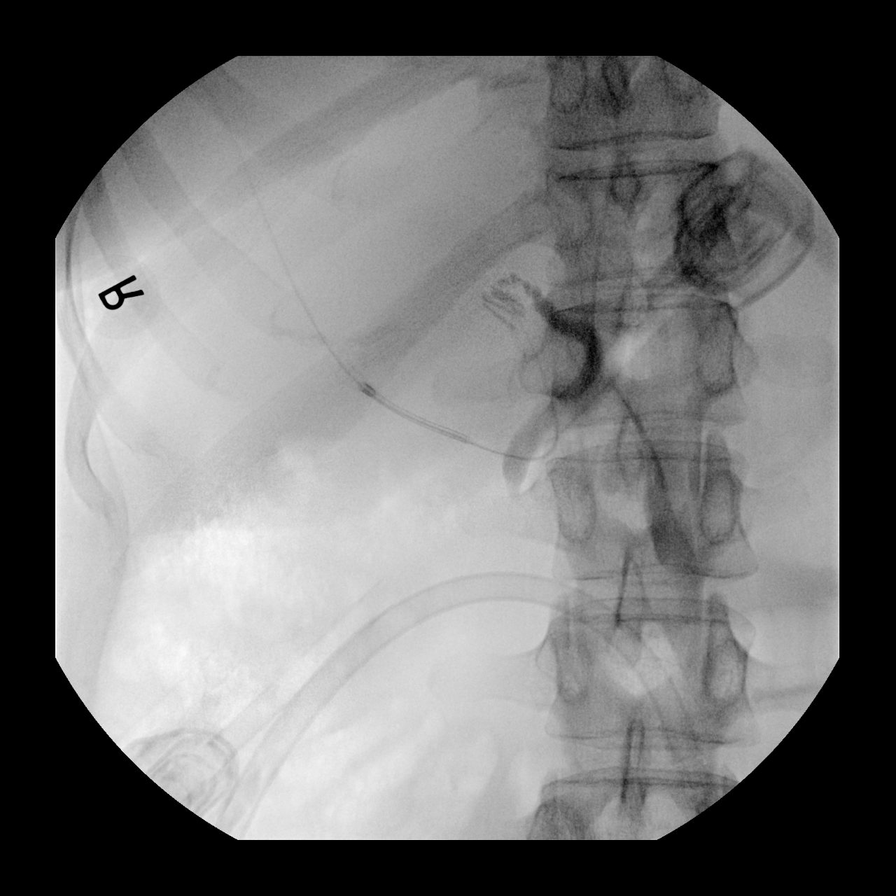
[frame 31/61]
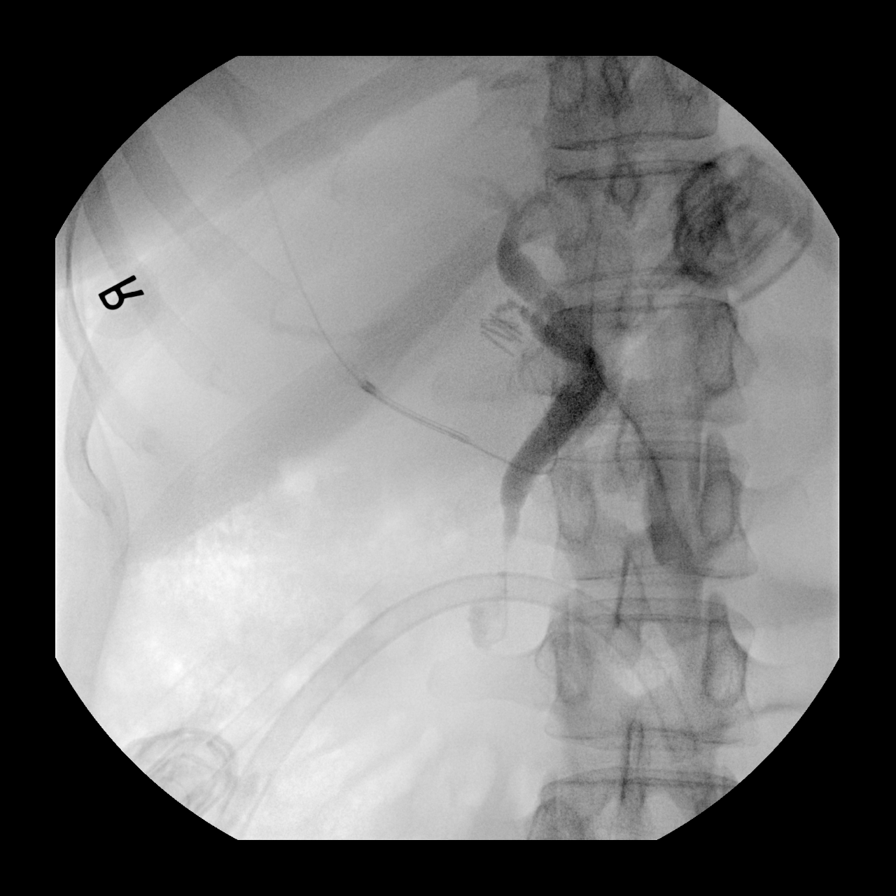
[frame 52/61]
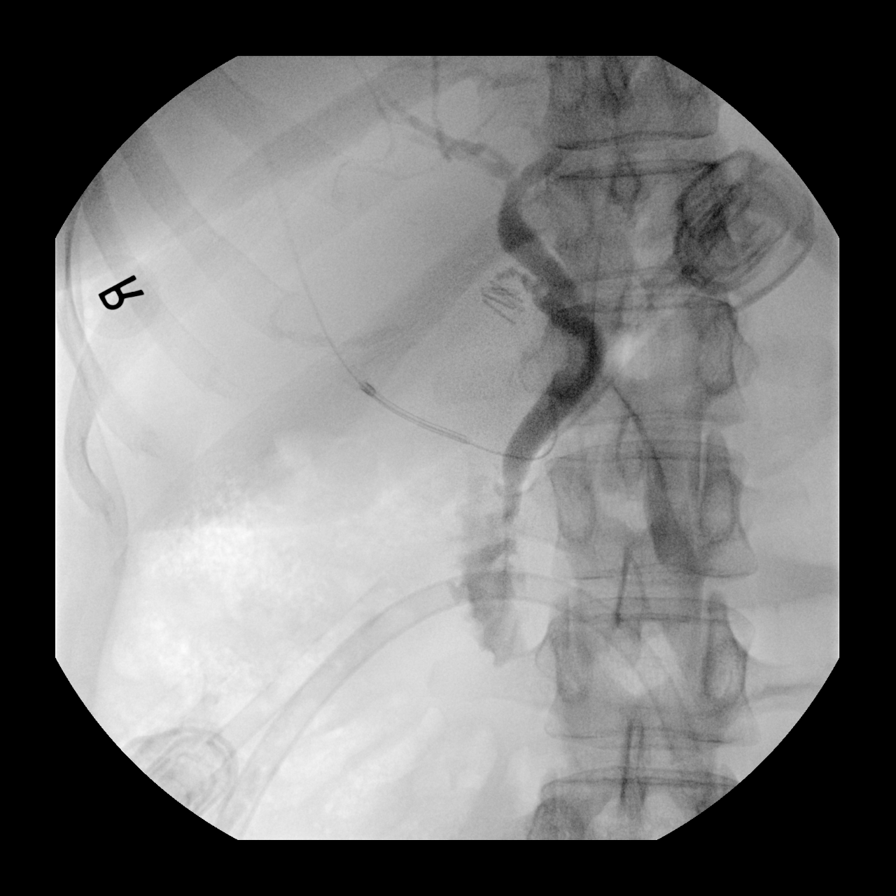

[4 of 4 positions shown; findings below may reference images not displayed]

FINDINGS: A single cine clip is submitted for review. The images demonstrate
cannulation of the cystic duct remanent and opacification of the
biliary tree. No evidence of biliary ductal dilation, stenosis,
stricture or choledocholithiasis. Contrast material passes freely
through the ampulla and into the duodenum.
IMPRESSION: Negative intraoperative cholangiogram.

## 2023-02-04 ENCOUNTER — Encounter: Payer: Self-pay | Admitting: Physician Assistant

## 2023-03-06 ENCOUNTER — Ambulatory Visit: Payer: Medicaid Other | Admitting: Physician Assistant

## 2023-03-06 NOTE — Progress Notes (Unsigned)
03/06/2023 Karen Hatfield [id number] 03/04/1991  Referring provider: Dianna Rossetti, NP Primary GI doctor: {acdocs:[id number]}  ASSESSMENT AND PLAN:   There are no diagnoses linked to this encounter.   Patient Care Team: System, Provider Not In as PCP - General  HISTORY OF PRESENT ILLNESS: 32 y.o. female with a past medical history of asthma, status post cholecystectomy with Dr. Marlou Starks 10/2021 and others listed below presents for evaluation of ***.  Patient has family history of GI cancer Patient has history of previous anemia, status post IUD.  Recheck prior to cholecystectomy improved.  She  reports that she has never smoked. She has never used smokeless tobacco. She reports that she does not currently use alcohol. She reports that she does not currently use drugs.  RELEVANT LABS AND IMAGING: CBC    Component Value Date/Time   WBC 4.9 10/30/2021 1400   RBC 4.85 10/30/2021 1400   HGB 12.4 10/30/2021 1400   HCT 40.3 10/30/2021 1400   PLT 256 10/30/2021 1400   MCV 83.1 10/30/2021 1400   MCH 25.6 (L) 10/30/2021 1400   MCHC 30.8 10/30/2021 1400   RDW 18.3 (H) 10/30/2021 1400   LYMPHSABS 1.8 08/20/2021 1109   MONOABS 1.0 08/20/2021 1109   EOSABS 0.1 08/20/2021 1109   BASOSABS 0.0 08/20/2021 1109   No results for input(s): "HGB" in the last 8760 hours.   CMP     Component Value Date/Time   NA 136 08/20/2021 1109   K 3.4 (L) 08/20/2021 1109   CL 107 08/20/2021 1109   CO2 25 08/20/2021 1109   GLUCOSE 88 08/20/2021 1109   BUN <5 (L) 08/20/2021 1109   CREATININE 0.83 08/20/2021 1109   CALCIUM 8.5 (L) 08/20/2021 1109   PROT 5.9 (L) 08/20/2021 1109   ALBUMIN 2.5 (L) 08/20/2021 1109   AST 42 (H) 08/20/2021 1109   ALT 20 08/20/2021 1109   ALKPHOS 165 (H) 08/20/2021 1109   BILITOT 1.0 08/20/2021 1109   GFRNONAA >60 08/20/2021 1109      Latest Ref Rng & Units 08/20/2021   11:09 AM 05/16/2021    3:57 AM  Hepatic Function  Total Protein 6.5 - 8.1 g/dL 5.9  7.0    Albumin 3.5 - 5.0 g/dL 2.5  3.4   AST 15 - 41 U/L 42  18   ALT 0 - 44 U/L 20  14   Alk Phosphatase 38 - 126 U/L 165  89   Total Bilirubin 0.3 - 1.2 mg/dL 1.0  0.5       Current Medications:     Current Outpatient Medications (Respiratory):    albuterol (PROVENTIL HFA;VENTOLIN HFA) 108 (90 BASE) MCG/ACT inhaler, Inhale 2 puffs into the lungs every 6 (six) hours as needed for wheezing or shortness of breath.   beclomethasone (QVAR) 40 MCG/ACT inhaler, Inhale 2 puffs into the lungs 2 (two) times daily at 10 AM and 5 PM. (Patient not taking: No sig reported)  Current Outpatient Medications (Analgesics):    acetaminophen (TYLENOL) 500 MG tablet, Take 500-1,000 mg by mouth every 6 (six) hours as needed for moderate pain.   HYDROcodone-acetaminophen (NORCO/VICODIN) 5-325 MG tablet, Take 1 tablet by mouth every 6 (six) hours as needed for moderate pain or severe pain.  Current Outpatient Medications (Hematological):    ferrous sulfate 325 (65 FE) MG tablet, Take 1 tablet (325 mg total) by mouth every other day.  Current Outpatient Medications (Other):    famotidine (PEPCID) 20 MG tablet, Take 1 tablet (20 mg  total) by mouth 2 (two) times daily. (Patient not taking: No sig reported)   Magnesium 250 MG TABS, Take 250 mg by mouth daily.   Prenat-FeCbn-FeAsp-Meth-FA-DHA (PRENATE MINI) 18-0.6-0.4-350 MG CAPS, Take 1 tablet by mouth daily.  Medical History:  Past Medical History:  Diagnosis Date   Anemia    Asthma    Dyspnea    Fainting episodes    Frequent headaches    Gallstones    Migraines    Myalgia and myositis 10/30/2014   Allergies: No Known Allergies   Surgical History:  She  has a past surgical history that includes Wisdom tooth extraction (2019) and Cholecystectomy (N/A, 11/04/2021). Family History:  Her family history includes Cancer in her mother; Depression in her maternal grandfather and maternal grandmother; Heart disease in her paternal grandmother; Hypertension in  her maternal grandmother; Stroke in her maternal grandmother; Vision loss in her maternal grandfather.  REVIEW OF SYSTEMS  : All other systems reviewed and negative except where noted in the History of Present Illness.  PHYSICAL EXAM: There were no vitals taken for this visit. General Appearance: Well nourished, in no apparent distress. Head:   Normocephalic and atraumatic. Eyes:  sclerae anicteric,conjunctive pink  Respiratory: Respiratory effort normal, BS equal bilaterally without rales, rhonchi, wheezing. Cardio: RRR with no MRGs. Peripheral pulses intact.  Abdomen: Soft,  {BlankSingle:19197::"Flat","Obese","Non-distended"} ,active bowel sounds. {actendernessAB:27319} tenderness {anatomy; site abdomen:5010}. {BlankMultiple:19196::"Without guarding","With guarding","Without rebound","With rebound"}. No masses. Rectal: {acrectalexam:27461} Musculoskeletal: Full ROM, {PSY - GAIT AND STATION:22860} gait. {With/Without:[id number]} edema. Skin:  Dry and intact without significant lesions or rashes Neuro: Alert and  oriented x4;  No focal deficits. Psych:  Cooperative. Normal mood and affect.    Karen Crofts, PA-C 12:57 PM

## 2023-03-09 ENCOUNTER — Ambulatory Visit: Payer: Medicaid Other | Admitting: Physician Assistant

## 2023-03-09 ENCOUNTER — Other Ambulatory Visit (INDEPENDENT_AMBULATORY_CARE_PROVIDER_SITE_OTHER): Payer: Medicaid Other

## 2023-03-09 ENCOUNTER — Encounter: Payer: Self-pay | Admitting: Physician Assistant

## 2023-03-09 VITALS — BP 110/80 | HR 88 | Ht 64.75 in | Wt 201.5 lb

## 2023-03-09 DIAGNOSIS — Z9049 Acquired absence of other specified parts of digestive tract: Secondary | ICD-10-CM

## 2023-03-09 DIAGNOSIS — R1084 Generalized abdominal pain: Secondary | ICD-10-CM | POA: Diagnosis not present

## 2023-03-09 DIAGNOSIS — Z800 Family history of malignant neoplasm of digestive organs: Secondary | ICD-10-CM | POA: Diagnosis not present

## 2023-03-09 DIAGNOSIS — R195 Other fecal abnormalities: Secondary | ICD-10-CM

## 2023-03-09 LAB — COMPREHENSIVE METABOLIC PANEL
ALT: 17 U/L (ref 0–35)
AST: 18 U/L (ref 0–37)
Albumin: 4.2 g/dL (ref 3.5–5.2)
Alkaline Phosphatase: 99 U/L (ref 39–117)
BUN: 12 mg/dL (ref 6–23)
CO2: 27 mEq/L (ref 19–32)
Calcium: 9.3 mg/dL (ref 8.4–10.5)
Chloride: 103 mEq/L (ref 96–112)
Creatinine, Ser: 1.12 mg/dL (ref 0.40–1.20)
GFR: 64.74 mL/min (ref >60.00)
Glucose, Bld: 93 mg/dL (ref 70–99)
Potassium: 3.8 mEq/L (ref 3.5–5.1)
Sodium: 137 mEq/L (ref 135–145)
Total Bilirubin: 0.3 mg/dL (ref 0.2–1.2)
Total Protein: 7.7 g/dL (ref 6.0–8.3)

## 2023-03-09 LAB — CBC WITH DIFFERENTIAL/PLATELET
Basophils Absolute: 0 10*3/uL (ref 0.0–0.1)
Basophils Relative: 0.3 % (ref 0.0–3.0)
Eosinophils Absolute: 0 10*3/uL (ref 0.0–0.7)
Eosinophils Relative: 0.9 % (ref 0.0–5.0)
HCT: 40.3 % (ref 36.0–46.0)
Hemoglobin: 13 g/dL (ref 12.0–15.0)
Lymphocytes Relative: 31.4 % (ref 12.0–46.0)
Lymphs Abs: 1.7 10*3/uL (ref 0.7–4.0)
MCHC: 32.3 g/dL (ref 30.0–36.0)
MCV: 85.6 fl (ref 78.0–100.0)
Monocytes Absolute: 0.4 10*3/uL (ref 0.1–1.0)
Monocytes Relative: 7.3 % (ref 3.0–12.0)
Neutro Abs: 3.3 10*3/uL (ref 1.4–7.7)
Neutrophils Relative %: 60.1 % (ref 43.0–77.0)
Platelets: 316 10*3/uL (ref 150.0–400.0)
RBC: 4.71 Mil/uL (ref 3.87–5.11)
RDW: 13.7 % (ref 11.5–15.5)
WBC: 5.6 10*3/uL (ref 4.0–10.5)

## 2023-03-09 MED ORDER — NA SULFATE-K SULFATE-MG SULF 17.5-3.13-1.6 GM/177ML PO SOLN: 1.0000 | Freq: Once | ORAL | 0 refills | Status: AC

## 2023-03-09 NOTE — Patient Instructions (Addendum)
You have been scheduled for a colonoscopy. Please follow written instructions given to you at your visit today.  Please pick up your prep supplies at the pharmacy within the next 1-3 days. If you use inhalers (even only as needed), please bring them with you on the day of your procedure.   Your provider has requested that you go to the basement level for lab work before leaving today. Press "B" on the elevator. The lab is located at the first door on the left as you exit the elevator.  First do a trial off milk/lactose products if you use them.  Add fiber like benefiber or citracel once a day Increase activity Can do trial of IBGard which is over the counter for AB pain- Take 1-2 capsules once a day for maintence or twice a day during a flare  Please try low FODMAP diet- see below- start with eliminating just one column at a time, the table at the very bottom contains foods that are safe to take   FODMAP stands for fermentable oligo-, di-, mono-saccharides and polyols (1). These are the scientific terms used to classify groups of carbs that are notorious for triggering digestive symptoms like bloating, gas and stomach pain.       _______________________________________________________  If your blood pressure at your visit was 140/90 or greater, please contact your primary care physician to follow up on this.  _______________________________________________________  If you are age 40 or older, your body mass index should be between 23-30. Your Body mass index is 33.79 kg/m. If this is out of the aforementioned range listed, please consider follow up with your Primary Care Provider.  If you are age 41 or younger, your body mass index should be between 19-25. Your Body mass index is 33.79 kg/m. If this is out of the aformentioned range listed, please consider follow up with your Primary Care Provider.   ________________________________________________________  The [facility] providers  would like to encourage you to use [facility] to communicate with providers for non-urgent requests or questions.  Due to long hold times on the telephone, sending your provider a message by [facility] may be a faster and more efficient way to get a response.  Please allow 48 business hours for a response.  Please remember that this is for non-urgent requests.  _______________________________________________________ It was a pleasure to see you today!  Thank you for trusting me with your gastrointestinal care!

## 2023-04-13 ENCOUNTER — Ambulatory Visit (AMBULATORY_SURGERY_CENTER): Payer: Medicaid Other | Admitting: Gastroenterology

## 2023-04-13 ENCOUNTER — Encounter: Payer: Self-pay | Admitting: Gastroenterology

## 2023-04-13 VITALS — BP 122/59 | HR 65 | Temp 97.8°F | Resp 15 | Ht 64.0 in | Wt 201.0 lb

## 2023-04-13 DIAGNOSIS — Z800 Family history of malignant neoplasm of digestive organs: Secondary | ICD-10-CM

## 2023-04-13 DIAGNOSIS — Z1211 Encounter for screening for malignant neoplasm of colon: Secondary | ICD-10-CM

## 2023-04-13 DIAGNOSIS — D128 Benign neoplasm of rectum: Secondary | ICD-10-CM

## 2023-04-13 DIAGNOSIS — K621 Rectal polyp: Secondary | ICD-10-CM | POA: Diagnosis not present

## 2023-04-13 DIAGNOSIS — R1084 Generalized abdominal pain: Secondary | ICD-10-CM

## 2023-04-13 MED ORDER — SODIUM CHLORIDE 0.9 % IV SOLN: 500.0000 mL | Freq: Once | INTRAVENOUS | Status: AC

## 2023-04-13 NOTE — Progress Notes (Unsigned)
[facility] History and Physical   Primary Care Physician:  System, Provider Not In   Reason for Procedure:  Family h/o colon cancer in mother at age 34  Plan:    colonoscopy with possible interventions as needed     HPI: Karen Hatfield is a very pleasant 32 y.o. female here for colonoscopy for evaluation, has family h/o colon cancer in mother at age 20   The risks and benefits as well as alternatives of endoscopic procedure(s) have been discussed and reviewed. All questions answered. The patient agrees to proceed.    Past Medical History:  Diagnosis Date   Anemia    Asthma    Dyspnea    Fainting episodes    Frequent headaches    Gallstones    Migraines    Myalgia and myositis 10/30/2014    Past Surgical History:  Procedure Laterality Date   CHOLECYSTECTOMY N/A 11/04/2021   Procedure: LAPAROSCOPIC CHOLECYSTECTOMY WITH INTRAOPERATIVE CHOLANGIOGRAM;  Surgeon: Griselda Miner, MD;  Location: [facility] OR;  Service: General;  Laterality: N/A;   WISDOM TOOTH EXTRACTION  2019    Prior to Admission medications   Medication Sig Start Date End Date Taking? Authorizing Provider  diphenhydrAMINE (BENADRYL ALLERGY) 25 mg capsule  04/06/23  Yes [provider]  acetaminophen (TYLENOL) 500 MG tablet Take 500-1,000 mg by mouth every 6 (six) hours as needed for moderate pain.    [provider]  albuterol (PROVENTIL HFA;VENTOLIN HFA) 108 (90 BASE) MCG/ACT inhaler Inhale 2 puffs into the lungs every 6 (six) hours as needed for wheezing or shortness of breath. Patient not taking: Reported on 03/09/2023 11/08/14   Saguier, Ramon Dredge, PA-C  AMBULATORY NON FORMULARY MEDICATION Dr Linna Darner Gallbladder enzymes Take 1 tablet before a big meal    [provider]  beclomethasone (QVAR) 40 MCG/ACT inhaler Inhale 2 puffs into the lungs 2 (two) times daily at 10 AM and 5 PM. Patient not taking: Reported on 10/24/2021 11/08/14   Saguier, Ramon Dredge, PA-C  calcium carbonate  (TUMS EX) 750 MG chewable tablet Chew 2 tablets by mouth as needed for heartburn.    [provider]  ferrous sulfate 325 (65 FE) MG tablet Take 1 tablet (325 mg total) by mouth every other day. Patient not taking: Reported on 03/09/2023 08/20/21 03/09/23  Nugent, Odie Sera, NP  lactase (LACTAID) 3000 units tablet Take 3,000 Units by mouth. Before eating dairy    [provider]  levonorgestrel (MIRENA, 52 MG,) 20 MCG/DAY IUD 1 each by Intrauterine route once. 11/15/21   [provider]  Magnesium 250 MG TABS Take 250 mg by mouth daily.    [provider]  Prenatal Vit-Fe Fumarate-FA (PRENATAL MULTIVITAMIN) TABS tablet Take 1 tablet by mouth daily at 12 noon.    [provider]    Current Outpatient Medications  Medication Sig Dispense Refill   diphenhydrAMINE (BENADRYL ALLERGY) 25 mg capsule      acetaminophen (TYLENOL) 500 MG tablet Take 500-1,000 mg by mouth every 6 (six) hours as needed for moderate pain.     albuterol (PROVENTIL HFA;VENTOLIN HFA) 108 (90 BASE) MCG/ACT inhaler Inhale 2 puffs into the lungs every 6 (six) hours as needed for wheezing or shortness of breath. (Patient not taking: Reported on 03/09/2023) 1 Inhaler 1   AMBULATORY NON FORMULARY MEDICATION Dr Linna Darner Gallbladder enzymes Take 1 tablet before a big meal     beclomethasone (QVAR) 40 MCG/ACT inhaler Inhale 2 puffs into the lungs 2 (two) times daily at 10 AM  and 5 PM. (Patient not taking: Reported on 10/24/2021) 1 Inhaler 1   calcium carbonate (TUMS EX) 750 MG chewable tablet Chew 2 tablets by mouth as needed for heartburn.     ferrous sulfate 325 (65 FE) MG tablet Take 1 tablet (325 mg total) by mouth every other day. (Patient not taking: Reported on 03/09/2023) 30 tablet 1   lactase (LACTAID) 3000 units tablet Take 3,000 Units by mouth. Before eating dairy     levonorgestrel (MIRENA, 52 MG,) 20 MCG/DAY IUD 1 each by Intrauterine route once.     Magnesium 250 MG TABS Take 250 mg  by mouth daily.     Prenatal Vit-Fe Fumarate-FA (PRENATAL MULTIVITAMIN) TABS tablet Take 1 tablet by mouth daily at 12 noon.     Current Facility-Administered Medications  Medication Dose Route Frequency Provider Last Rate Last Admin   0.9 %  sodium chloride infusion  500 mL Intravenous Once Kealani Leckey, Eleonore Chiquito, MD        Allergies as of 04/13/2023 - Review Complete 04/13/2023  Allergen Reaction Noted   Pollen extract  03/09/2023   Dust mite extract  03/09/2023   Miconazole Itching 03/09/2023    Family History  Problem Relation Age of Onset   Colon cancer Mother 39   Diabetes Mother    Asthma Sister        several on mom and dads   Asthma Brother        several on mom and dads   Stroke Maternal Grandmother    Hypertension Maternal Grandmother    Depression Maternal Grandmother    Dementia Maternal Grandmother    Vision loss Maternal Grandfather    Hypertension Maternal Grandfather    Heart disease Paternal Grandmother     Social History   Socioeconomic History   Marital status: Married    Spouse name: Copy   Number of children: 1   Years of education: Not on file   Highest education level: Not on file  Occupational History   Occupation: Lawyer  Tobacco Use   Smoking status: Never   Smokeless tobacco: Never  Vaping Use   Vaping Use: Former   Substances: CBD   Devices: "Only a couple times"  Substance and Sexual Activity   Alcohol use: Yes    Comment: 1 per day   Drug use: Not Currently   Sexual activity: Yes    Birth control/protection: None  Other Topics Concern   Not on file  Social History Narrative   Not on file   Social Determinants of Health   Financial Resource Strain: Not on file  Food Insecurity: Not on file  Transportation Needs: Not on file  Physical Activity: Not on file  Stress: Not on file  Social Connections: Not on file  Intimate Partner Violence: Not on file    Review of Systems:  All other review of systems negative  except as mentioned in the HPI.  Physical Exam: Vital signs in last 24 hours: Blood Pressure 114/63   Pulse 83   Temperature 97.8 F (36.6 C)   Height  (1.626 m)   Weight 201 lb (91.2 kg)   Oxygen Saturation 99%   Body Mass Index 34.50 kg/m  General:   Alert, NAD Lungs:  Clear .   Heart:  Regular rate and rhythm Abdomen:  Soft, nontender and nondistended. Neuro/Psych:  Alert and cooperative. Normal mood and affect. A and O x 3  Reviewed labs, radiology imaging, old records and pertinent past GI  work up  Patient is appropriate for planned procedure(s) and anesthesia in an ambulatory setting   K. Denzil Magnuson , MD [phone]

## 2023-04-13 NOTE — Patient Instructions (Addendum)
Thank you for letting us take care of your healthcare needs today. Please see handouts given to you on Polyps and Hemorrhoids.    YOU HAD AN ENDOSCOPIC PROCEDURE TODAY AT THE [facility]:   Refer to the procedure report that was given to you for any specific questions about what was found during the examination.  If the procedure report does not answer your questions, please call your gastroenterologist to clarify.  If you requested that your care partner not be given the details of your procedure findings, then the procedure report has been included in a sealed envelope for you to review at your convenience later.  YOU SHOULD EXPECT: Some feelings of bloating in the abdomen. Passage of more gas than usual.  Walking can help get rid of the air that was put into your GI tract during the procedure and reduce the bloating. If you had a lower endoscopy (such as a colonoscopy or flexible sigmoidoscopy) you may notice spotting of blood in your stool or on the toilet paper. If you underwent a bowel prep for your procedure, you may not have a normal bowel movement for a few days.  Please Note:  You might notice some irritation and congestion in your nose or some drainage.  This is from the oxygen used during your procedure.  There is no need for concern and it should clear up in a day or so.  SYMPTOMS TO REPORT IMMEDIATELY:  Following lower endoscopy (colonoscopy or flexible sigmoidoscopy):  Excessive amounts of blood in the stool  Significant tenderness or worsening of abdominal pains  Swelling of the abdomen that is new, acute  Fever of 100F or higher   For urgent or emergent issues, a gastroenterologist can be reached at any hour by calling [phone]. Do not use MyChart messaging for urgent concerns.    DIET:  We do recommend a small meal at first, but then you may proceed to your regular diet.  Drink plenty of fluids but you should avoid alcoholic beverages for 24  hours.  ACTIVITY:  You should plan to take it easy for the rest of today and you should NOT DRIVE or use heavy machinery until tomorrow (because of the sedation medicines used during the test).    FOLLOW UP: Our staff will call the number listed on your records the next business day following your procedure.  We will call around 7:15- 8:00 am to check on you and address any questions or concerns that you may have regarding the information given to you following your procedure. If we do not reach you, we will leave a message.     If any biopsies were taken you will be contacted by phone or by letter within the next 1-3 weeks.  Please call us at [phone] if you have not heard about the biopsies in 3 weeks.    SIGNATURES/CONFIDENTIALITY: You and/or your care partner have signed paperwork which will be entered into your electronic medical record.  These signatures attest to the fact that that the information above on your After Visit Summary has been reviewed and is understood.  Full responsibility of the confidentiality of this discharge information lies with you and/or your care-partner. 

## 2023-04-13 NOTE — Progress Notes (Unsigned)
Called to room to assist during endoscopic procedure.  Patient ID and intended procedure confirmed with present staff. Received instructions for my participation in the procedure from the performing physician.  

## 2023-04-13 NOTE — Op Note (Signed)
[facility] Patient Name: Karen Hatfield Procedure Date: 04/13/2023 3:30 PM MRN: [id number] Endoscopist: Napoleon Form , MD, [id number] Age: 32 Referring MD:  Date of Birth: 10/30/1991 Gender: Female Account #: [id number] Procedure:                Colonoscopy Indications:              Screening in patient at increased risk: Family                            history of 1st-degree relative with colorectal                            cancer (Mother at age 80), Colon cancer screening                            in patient at increased risk: Family history of                            hereditary nonpolyposis colorectal cancer in                            1st-degree relative Medicines:                Monitored Anesthesia Care Procedure:                Pre-Anesthesia Assessment:                           - Prior to the procedure, a History and Physical                            was performed, and patient medications and                            allergies were reviewed. The patient's tolerance of                            previous anesthesia was also reviewed. The risks                            and benefits of the procedure and the sedation                            options and risks were discussed with the patient.                            All questions were answered, and informed consent                            was obtained. Prior Anticoagulants: The patient has                            taken no anticoagulant or antiplatelet agents. ASA  Grade Assessment: II - A patient with mild systemic                            disease. After reviewing the risks and benefits,                            the patient was deemed in satisfactory condition to                            undergo the procedure.                           After obtaining informed consent, the colonoscope                            was passed under direct vision. Throughout  the                            procedure, the patient's blood pressure, pulse, and                            oxygen saturations were monitored continuously. The                            Olympus PCF-H190DL ([id number]) Colonoscope was                            introduced through the anus and advanced to the the                            cecum, identified by appendiceal orifice and                            ileocecal valve. The colonoscopy was performed                            without difficulty. The patient tolerated the                            procedure well. The quality of the bowel                            preparation was good. The ileocecal valve,                            appendiceal orifice, and rectum were photographed. Scope In: 3:43:21 PM Scope Out: 3:55:14 PM Scope Withdrawal Time: 0 hours 7 minutes 51 seconds  Total Procedure Duration: 0 hours 11 minutes 53 seconds  Findings:                 The perianal and digital rectal examinations were                            normal.  A less than 1 mm polyp was found in the rectum. The                            polyp was sessile. The polyp was removed with a                            cold biopsy forceps. Resection and retrieval were                            complete.                           Non-bleeding external and internal hemorrhoids were                            found during retroflexion. The hemorrhoids were                            small.                           The exam was otherwise without abnormality. Complications:            No immediate complications. Estimated Blood Loss:     Estimated blood loss was minimal. Impression:               - One less than 1 mm polyp in the rectum, removed                            with a cold biopsy forceps. Resected and retrieved.                           - Non-bleeding external and internal hemorrhoids.                           - The  examination was otherwise normal.                           - The GI Genius (intelligent endoscopy module),                            computer-aided polyp detection system powered by AI                            was utilized to detect colorectal polyps through                            enhanced visualization during colonoscopy. Recommendation:           - Patient has a contact number available for                            emergencies. The signs and symptoms of potential  delayed complications were discussed with the                            patient. Return to normal activities tomorrow.                            Written discharge instructions were provided to the                            patient.                           - Resume previous diet.                           - Continue present medications.                           - Await pathology results.                           - Repeat colonoscopy in 5 years for surveillance. Napoleon Form, MD 04/13/2023 4:00:29 PM This report has been signed electronically.

## 2023-04-13 NOTE — Progress Notes (Signed)
Uneventful anesthetic. Report to pacu rn. Vss. Care resumed by rn. 

## 2023-04-14 ENCOUNTER — Other Ambulatory Visit (HOSPITAL_COMMUNITY)
Admission: RE | Admit: 2023-04-14 | Discharge: 2023-04-14 | Disposition: A | Discharge status: Home / Self Care | Payer: Medicaid Other | Source: Ambulatory Visit | Attending: Obstetrics and Gynecology | Admitting: Obstetrics and Gynecology

## 2023-04-14 ENCOUNTER — Other Ambulatory Visit: Payer: Self-pay

## 2023-04-14 ENCOUNTER — Telehealth: Payer: Self-pay | Admitting: *Deleted

## 2023-04-14 ENCOUNTER — Encounter: Payer: Self-pay | Admitting: Obstetrics and Gynecology

## 2023-04-14 ENCOUNTER — Ambulatory Visit (INDEPENDENT_AMBULATORY_CARE_PROVIDER_SITE_OTHER): Payer: Medicaid Other | Admitting: Obstetrics and Gynecology

## 2023-04-14 VITALS — BP 89/64 | HR 84 | Wt 202.4 lb

## 2023-04-14 DIAGNOSIS — Z01419 Encounter for gynecological examination (general) (routine) without abnormal findings: Secondary | ICD-10-CM | POA: Insufficient documentation

## 2023-04-14 DIAGNOSIS — Z124 Encounter for screening for malignant neoplasm of cervix: Secondary | ICD-10-CM

## 2023-04-14 NOTE — Progress Notes (Signed)
ANNUAL EXAM Patient name: Karen Hatfield MRN [id number]  Date of birth: Dec 12, 1991 Chief Complaint:   Gynecologic Exam  History of Present Illness:   Karen Hatfield is a 32 y.o. G1P1001 being seen today for a routine annual exam.  Current complaints: annual   Menstrual concerns? No  most recent was slightly longer than usual with a little bit discharge currently  Hx of bad yeast infeciton Had some vaginal irritation prior to onset of most recent menses  Breast or nipple changes? No  Contraception use? Yes IUD in place Sexually active? Yes   Patient's last menstrual period was 04/03/2023 (approximate).   Last pap No results found for: "DIAGPAP", "HPVHIGH", "ADEQPAP" Last mammogram: n/a Last colonoscopy: n/a     04/14/2023    3:59 PM  Depression screen PHQ 2/9  Decreased Interest 0  Down, Depressed, Hopeless 0  PHQ - 2 Score 0  Altered sleeping 2  Tired, decreased energy 1  Change in appetite 1  Feeling bad or failure about yourself  0  Trouble concentrating 0  Moving slowly or fidgety/restless 0  Suicidal thoughts 0  PHQ-9 Score 4        04/14/2023    4:00 PM  GAD 7 : Generalized Anxiety Score  Nervous, Anxious, on Edge 0  Control/stop worrying 0  Worry too much - different things 2  Trouble relaxing 1  Restless 0  Easily annoyed or irritable 1  Afraid - awful might happen 0  Total GAD 7 Score 4     Review of Systems:   Pertinent items are noted in HPI Denies any headaches, blurred vision, fatigue, shortness of breath, chest pain, abdominal pain, abnormal vaginal discharge/itching/odor/irritation, problems with periods, bowel movements, urination, or intercourse unless otherwise stated above. Pertinent History Reviewed:  Reviewed past medical,surgical, social and family history.  Reviewed problem list, medications and allergies. Physical Assessment:   Vitals:   04/14/23 1432  BP: (!) 89/64  Pulse: 84  Weight: 202 lb 6.4 oz (91.8 kg)  Body mass index  is 34.74 kg/m.        Physical Examination:   General appearance - well appearing, and in no distress  Mental status - alert, oriented to person, place, and time  Psych:  She has a normal mood and affect  Skin - warm and dry, normal color, no suspicious lesions noted  Chest - effort normal, all lung fields clear to auscultation bilaterally  Heart - normal rate and regular rhythm  Breasts - breasts appear normal, no suspicious masses, no skin or nipple changes or  axillary nodes  Abdomen - soft, nontender, nondistended, no masses or organomegaly  Pelvic -  VULVA: normal appearing vulva with no masses, tenderness or lesions   VAGINA: normal appearing vagina with normal color and discharge, no lesions   CERVIX: normal appearing cervix without discharge or lesions, IUD strings not visualized  Thin prep pap is done with HR HPV cotesting  Extremities:  No swelling or varicosities noted  Chaperone present for exam  No results found for this or any previous visit (from the past 24 hour(s)).    Assessment & Plan:  1. Well woman exam with routine gynecological exam - Cervical cancer screening: Discussed guidelines. Pap with HPV collected - GC/CT: accepts - Birth Control: IUD - Breast Health: Encouraged self breast awareness/SBE. Teaching provided.  - F/U 12 months and prn  - Cervicovaginal ancillary only - Cytology - PAP  2. Screening for cervical cancer Pap collected today  Follow-up: Return for Annual GYN.  Lorriane Shire, MD 04/14/2023 6:23 PM

## 2023-04-14 NOTE — Telephone Encounter (Signed)
Post procedure follow up call placed, no answer and left VM.  

## 2023-04-14 NOTE — Progress Notes (Signed)
Reviewed and agree with documentation and assessment and plan. K. Veena Alixander Rallis , MD   

## 2023-04-15 ENCOUNTER — Other Ambulatory Visit: Payer: Self-pay | Admitting: Obstetrics and Gynecology

## 2023-04-15 LAB — CERVICOVAGINAL ANCILLARY ONLY
Bacterial Vaginitis (gardnerella): NEGATIVE
Candida Glabrata: NEGATIVE
Candida Vaginitis: POSITIVE — AB
Chlamydia: NEGATIVE
Comment: NEGATIVE
Comment: NEGATIVE
Comment: NEGATIVE
Comment: NEGATIVE
Comment: NEGATIVE
Comment: NORMAL
Neisseria Gonorrhea: NEGATIVE
Trichomonas: NEGATIVE

## 2023-04-16 ENCOUNTER — Telehealth: Payer: Self-pay

## 2023-04-16 LAB — CYTOLOGY - PAP
Comment: NEGATIVE
Diagnosis: NEGATIVE
Diagnosis: REACTIVE
High risk HPV: NEGATIVE

## 2023-04-16 MED ORDER — FLUCONAZOLE 150 MG PO TABS: 150.0000 mg | ORAL_TABLET | Freq: Once | ORAL | 1 refills | Status: AC

## 2023-04-16 NOTE — Telephone Encounter (Addendum)
-----   Message from Lorriane Shire, MD sent at 04/15/2023  6:21 PM EDT ----- Please contact patient with result of yeast infection and ask if she can tolerate diflucan given documented allergy to miconazole.   Pt notified of results.  Pt states that she has taken Diflucan in the past that was effective in treatment. Diflucan e-prescribed.    Karen Hatfield

## 2023-04-28 ENCOUNTER — Encounter: Payer: Self-pay | Admitting: Gastroenterology

## 2023-06-10 ENCOUNTER — Ambulatory Visit: Payer: Medicaid Other | Admitting: Obstetrics and Gynecology

## 2023-06-22 ENCOUNTER — Ambulatory Visit (INDEPENDENT_AMBULATORY_CARE_PROVIDER_SITE_OTHER): Payer: Medicaid Other | Admitting: Obstetrics and Gynecology

## 2023-06-22 ENCOUNTER — Other Ambulatory Visit (HOSPITAL_COMMUNITY)
Admission: RE | Admit: 2023-06-22 | Discharge: 2023-06-22 | Disposition: A | Discharge status: Home / Self Care | Payer: Medicaid Other | Source: Ambulatory Visit | Attending: Obstetrics and Gynecology | Admitting: Obstetrics and Gynecology

## 2023-06-22 ENCOUNTER — Other Ambulatory Visit: Payer: Self-pay

## 2023-06-22 ENCOUNTER — Encounter: Payer: Self-pay | Admitting: Obstetrics and Gynecology

## 2023-06-22 VITALS — BP 118/72 | HR 81 | Wt 201.8 lb

## 2023-06-22 DIAGNOSIS — N949 Unspecified condition associated with female genital organs and menstrual cycle: Secondary | ICD-10-CM

## 2023-06-22 DIAGNOSIS — N898 Other specified noninflammatory disorders of vagina: Secondary | ICD-10-CM

## 2023-06-22 NOTE — Progress Notes (Signed)
GYNECOLOGY VISIT  Patient name: Karen Hatfield MRN [id number]  Date of birth: January 09, 1991 Chief Complaint:   recurrenting yeast infections    History:  Karen Hatfield is a 32 y.o. G1P1001 being seen today for concern of recurrent UTI vs vaginal infection. Preivoulsy did not have sensation of yeast infection but had a positive     Lower back pain and urinary odor. No abnormal vaginal discharge, no vaginal itching or pain with intercourse . Typically in the past would   May have gotten treated for a UTI last year. After IUD feels that she has been getting more frequent UTIs; has more hx of yeast infection but would note to have symptoms  No fever or chills   Typically gets a yeast infection after antibiotics.   States A1c was normal - has random episodes of syncope and PCP hasn't found reason. When she gets dizzy will drink orange juice   Past Medical History:  Diagnosis Date   Anemia    Asthma    Dyspnea    Fainting episodes    Frequent headaches    Gallstones    Migraines    Myalgia and myositis 10/30/2014    Past Surgical History:  Procedure Laterality Date   CHOLECYSTECTOMY N/A 11/04/2021   Procedure: LAPAROSCOPIC CHOLECYSTECTOMY WITH INTRAOPERATIVE CHOLANGIOGRAM;  Surgeon: Griselda Miner, MD;  Location: [facility] OR;  Service: General;  Laterality: N/A;   WISDOM TOOTH EXTRACTION  2019    The following portions of the patient's history were reviewed and updated as appropriate: allergies, current medications, past family history, past medical history, past social history, past surgical history and problem list.   Health Maintenance:   Last pap     Component Value Date/Time   DIAGPAP  04/14/2023 1451    - Negative for Intraepithelial Lesions or Malignancy (NILM)   DIAGPAP - Benign reactive/reparative changes 04/14/2023 1451   HPVHIGH Negative 04/14/2023 1451   ADEQPAP  04/14/2023 1451    Satisfactory for evaluation; transformation zone component PRESENT.    High Risk  HPV: Positive  Adequacy:  Satisfactory for evaluation, transformation zone component PRESENT  Diagnosis:  Atypical squamous cells of undetermined significance (ASC-US)  Last mammogram: n/a   Review of Systems:  Pertinent items are noted in HPI. Comprehensive review of systems was otherwise negative.   Objective:  Physical Exam BP 118/72   Pulse 81   Wt 201 lb 12.8 oz (91.5 kg)   BMI 34.64 kg/m    Physical Exam Vitals and nursing note reviewed.  Constitutional:      Appearance: Normal appearance.  HENT:     Head: Normocephalic and atraumatic.  Pulmonary:     Effort: Pulmonary effort is normal.  Skin:    General: Skin is warm and dry.  Neurological:     General: No focal deficit present.     Mental Status: She is alert.  Psychiatric:        Mood and Affect: Mood normal.        Behavior: Behavior normal.        Thought Content: Thought content normal.        Judgment: Judgment normal.       Assessment & Plan:   1. Vaginal discomfort - Urine Culture  2. Vaginal discharge Symptoms more concerning for possible UTI instead of vaginitis. Vaginal swab collected and will check urine as well. If UTI treated will need diflucan for yeast infection. If negative, consider uti/dysuria ppx - Cervicovaginal ancillary only   Routine  preventative health maintenance measures emphasized.  Darliss Cheney, MD [facility] [place]

## 2023-06-23 LAB — POCT URINALYSIS DIP (DEVICE)
Bilirubin Urine: NEGATIVE
Glucose, UA: NEGATIVE mg/dL
Ketones, ur: NEGATIVE mg/dL
Nitrite: NEGATIVE
Protein, ur: NEGATIVE mg/dL
Specific Gravity, Urine: >=1.03 (ref 1.005–1.030)
Urobilinogen, UA: 0.2 mg/dL (ref 0.0–1.0)
pH: 6 (ref 5.0–8.0)

## 2023-06-24 ENCOUNTER — Other Ambulatory Visit: Payer: Self-pay | Admitting: Obstetrics and Gynecology

## 2023-06-24 DIAGNOSIS — N898 Other specified noninflammatory disorders of vagina: Secondary | ICD-10-CM

## 2023-06-24 DIAGNOSIS — B9689 Other specified bacterial agents as the cause of diseases classified elsewhere: Secondary | ICD-10-CM

## 2023-06-24 LAB — CERVICOVAGINAL ANCILLARY ONLY
Bacterial Vaginitis (gardnerella): POSITIVE — AB
Candida Glabrata: NEGATIVE
Candida Vaginitis: NEGATIVE
Comment: NEGATIVE
Comment: NEGATIVE
Comment: NEGATIVE

## 2023-06-24 LAB — URINE CULTURE

## 2023-06-24 MED ORDER — METRONIDAZOLE 0.75 % VA GEL: 1.0000 | Freq: Every day | VAGINAL | 1 refills | Status: DC

## 2023-06-24 MED ORDER — FLUCONAZOLE 150 MG PO TABS
150.0000 mg | ORAL_TABLET | Freq: Once | ORAL | 3 refills | Status: AC
Start: 2023-06-24 — End: 2023-06-24

## 2023-06-26 ENCOUNTER — Encounter: Payer: Self-pay | Admitting: Obstetrics and Gynecology

## 2024-04-02 NOTE — Progress Notes (Signed)
Declined SDOH.

## 2024-04-18 ENCOUNTER — Ambulatory Visit: Admitting: Obstetrics and Gynecology

## 2024-04-18 ENCOUNTER — Other Ambulatory Visit: Payer: Self-pay

## 2024-04-18 VITALS — BP 107/66 | HR 84 | Ht 64.0 in | Wt 198.2 lb

## 2024-04-18 DIAGNOSIS — Z975 Presence of (intrauterine) contraceptive device: Secondary | ICD-10-CM

## 2024-04-18 DIAGNOSIS — Z30431 Encounter for routine checking of intrauterine contraceptive device: Secondary | ICD-10-CM

## 2024-04-18 NOTE — Progress Notes (Signed)
 GYNECOLOGY VISIT  Patient name: Karen Hatfield MRN [id number]  Date of birth: 18-Oct-1991 Chief Complaint:   Contraception (Can feel string from IUD. )  History:  Karen Hatfield is a 33 y.o. G1P1001 being seen today for IUD check. Had noticed that she could feel her strings when she previously could not.  She noticed a brief moment of having cramps, which that has resolved.  Within the time period from when she made the appointment to today she is no longer able to feel the strings.  She has not seen the IUD come out.  Past Medical History:  Diagnosis Date   Anemia    Asthma    Dyspnea    Fainting episodes    Frequent headaches    Gallstones    Migraines    Myalgia and myositis 10/30/2014    Past Surgical History:  Procedure Laterality Date   CHOLECYSTECTOMY N/A 11/04/2021   Procedure: LAPAROSCOPIC CHOLECYSTECTOMY WITH INTRAOPERATIVE CHOLANGIOGRAM;  Surgeon: Karen Chandler, MD;  Location: [facility] OR;  Service: General;  Laterality: N/A;   WISDOM TOOTH EXTRACTION  2019    The following portions of the patient's history were reviewed and updated as appropriate: allergies, current medications, past family history, past medical history, past social history, past surgical history and problem list.   Health Maintenance:   Last pap     Component Value Date/Time   DIAGPAP  04/14/2023 1451    - Negative for Intraepithelial Lesions or Malignancy (NILM)   DIAGPAP - Benign reactive/reparative changes 04/14/2023 1451   HPVHIGH Negative 04/14/2023 1451   ADEQPAP  04/14/2023 1451    Satisfactory for evaluation; transformation zone component PRESENT.    High Risk HPV: Positive  Adequacy:  Satisfactory for evaluation, transformation zone component PRESENT  Diagnosis:  Atypical squamous cells of undetermined significance (ASC-US )  Last mammogram: n/a   Review of Systems:  Pertinent items are noted in HPI. Comprehensive review of systems was otherwise negative.   Objective:   Physical Exam BP 107/66 (BP Location: Right Arm, Patient Position: Sitting, Cuff Size: Large)   Pulse 84   Ht 5\' 4"  (1.626 m)   Wt 198 lb 3.2 oz (89.9 kg)   SpO2 98%   BMI 34.02 kg/m    Physical Exam Vitals and nursing note reviewed. Exam conducted with a chaperone present.  Constitutional:      Appearance: Normal appearance.  HENT:     Head: Normocephalic and atraumatic.  Pulmonary:     Effort: Pulmonary effort is normal.     Breath sounds: Normal breath sounds.  Genitourinary:    General: Normal vulva.     Exam position: Lithotomy position.     Vagina: Normal.     Cervix: Normal.     Comments: IUD strings visualized, rod not visualized Small volume discharge noted Skin:    General: Skin is warm and dry.  Neurological:     General: No focal deficit present.     Mental Status: She is alert.  Psychiatric:        Mood and Affect: Mood normal.        Behavior: Behavior normal.        Thought Content: Thought content normal.        Judgment: Judgment normal.        Assessment & Plan:  1. IUD (intrauterine device) in place (Primary) IUD appears to be in correct position.  Noted that strings likely came untied briefly which is why she was able to feel  them, and should really return to their tucked position.  Discussed the possibility of cutting the strings of the become untucked more frequently.  Noted that would prefer to keep strings either slightly longer to allow for her to be tucked around the cervix, or cut at the level cervix so that there is no poking from the strings.   Routine preventative health maintenance measures emphasized.  Karen Pelton, MD [facility], [facility]

## 2024-05-16 NOTE — Progress Notes (Signed)
 Pt attended 04/02/24 screening event with BP of 104/74. Pt noted at event that she does have a PCP. At event pt did not indicate any SDOH needs. Pt also noted that she is not a smoker and listed Medicaid as her insurance at the event.   Per initial f/u [facility] called pt to verify PCP, pt stated that she is currently a pt at [facility] and has a team of physicians not just one.  Per chart review pt does have a PCP, insurance, and is not a smoker. Pt does not indicate any SDOH needs at this time.  No additional pt f/u to be scheduled at this time per health equity protocol.

## 2024-07-06 ENCOUNTER — Telehealth

## 2024-07-06 DIAGNOSIS — B379 Candidiasis, unspecified: Secondary | ICD-10-CM | POA: Diagnosis not present

## 2024-07-06 MED ORDER — FLUCONAZOLE 150 MG PO TABS
150.0000 mg | ORAL_TABLET | ORAL | 0 refills | Status: DC | PRN
Start: 2024-07-06 — End: 2024-11-25

## 2024-07-06 NOTE — Progress Notes (Signed)
 Virtual Visit Consent   Karen [facility]field, you are scheduled for a virtual visit with a [facility] provider today. Just as with appointments in the office, your consent must be obtained to participate. Your consent will be active for this visit and any virtual visit you may have with one of our providers in the next 365 days. If you have a MyChart account, a copy of this consent can be sent to you electronically.  As this is a virtual visit, video technology does not allow for your provider to perform a traditional examination. This may limit your provider's ability to fully assess your condition. If your provider identifies any concerns that need to be evaluated in person or the need to arrange testing (such as labs, EKG, etc.), we will make arrangements to do so. Although advances in technology are sophisticated, we cannot ensure that it will always work on either your end or our end. If the connection with a video visit is poor, the visit may have to be switched to a telephone visit. With either a video or telephone visit, we are not always able to ensure that we have a secure connection.  By engaging in this virtual visit, you consent to the provision of healthcare and authorize for your insurance to be billed (if applicable) for the services provided during this visit. Depending on your insurance coverage, you may receive a charge related to this service.  I need to obtain your verbal consent now. Are you willing to proceed with your visit today? Karen Hatfield has provided verbal consent on 07/06/2024 for a virtual visit (video or telephone). Delon CHRISTELLA Dickinson, PA-C  Date: 07/06/2024 4:50 PM   Virtual Visit via Video Note   I, Delon CHRISTELLA Dickinson, connected with  Karen Hatfield  ([id number], [birth year]) on 07/06/24 at  4:45 PM EDT by a video-enabled telemedicine application and verified that I am speaking with the correct person using two identifiers.  Location: Patient: Virtual Visit Location  Patient: Mobile Provider: Virtual Visit Location Provider: Home Office   I discussed the limitations of evaluation and management by telemedicine and the availability of in person appointments. The patient expressed understanding and agreed to proceed.    History of Present Illness: Karen Hatfield is a 33 y.o. who identifies as a female who was assigned female at birth, and is being seen today for vaginal irritation.  HPI: Vaginal Itching The patient's primary symptoms include genital itching and vaginal discharge. This is a new problem. The current episode started in the past 7 days (couple of days ago; used a new soap). The problem occurs constantly. The problem has been gradually worsening. Pertinent negatives include no back pain, chills, dysuria, fever, flank pain, frequency or nausea. The vaginal discharge was white and thick. There has been no bleeding. The symptoms are aggravated by tactile pressure. She has tried nothing for the symptoms. The treatment provided no relief.     Problems:  Patient Active Problem List   Diagnosis Date Noted   Pregnancy 09/11/2021   PROM (premature rupture of membranes) 09/10/2021   Cholecystitis 08/20/2021   Asthma exacerbation, mild 11/08/2014    Allergies:  Allergies  Allergen Reactions   Pollen Extract     Other Reaction(s): eye redness   Dust Mite Extract     Other Reaction(s): eye redness   Miconazole Itching    Yeast infection   Medications:  Current Outpatient Medications:    fluconazole  (DIFLUCAN ) 150 MG tablet, Take 1 tablet (150 mg total) by  mouth every 3 (three) days as needed., Disp: 2 tablet, Rfl: 0   acetaminophen  (TYLENOL ) 500 MG tablet, Take 500-1,000 mg by mouth every 6 (six) hours as needed for moderate pain., Disp: , Rfl:    albuterol  (PROVENTIL  HFA;VENTOLIN  HFA) 108 (90 BASE) MCG/ACT inhaler, Inhale 2 puffs into the lungs every 6 (six) hours as needed for wheezing or shortness of breath. (Patient not taking: Reported on  04/14/2023), Disp: 1 Inhaler, Rfl: 1   AMBULATORY NON FORMULARY MEDICATION, Dr Elsa Gallbladder enzymes Take 1 tablet before a big meal, Disp: , Rfl:    beclomethasone (QVAR ) 40 MCG/ACT inhaler, Inhale 2 puffs into the lungs 2 (two) times daily at 10 AM and 5 PM. (Patient not taking: Reported on 10/24/2021), Disp: 1 Inhaler, Rfl: 1   calcium carbonate (TUMS EX) 750 MG chewable tablet, Chew 2 tablets by mouth as needed for heartburn. (Patient not taking: Reported on 04/18/2024), Disp: , Rfl:    diphenhydrAMINE  (BENADRYL  ALLERGY) 25 mg capsule, , Disp: , Rfl:    ferrous sulfate  325 (65 FE) MG tablet, Take 1 tablet (325 mg total) by mouth every other day. (Patient not taking: Reported on 03/09/2023), Disp: 30 tablet, Rfl: 1   lactase (LACTAID) 3000 units tablet, Take 3,000 Units by mouth. Before eating dairy (Patient not taking: Reported on 04/18/2024), Disp: , Rfl:    levonorgestrel (MIRENA, 52 MG,) 20 MCG/DAY IUD, 1 each by Intrauterine route once. (Patient not taking: Reported on 04/18/2024), Disp: , Rfl:    Magnesium 250 MG TABS, Take 250 mg by mouth daily. (Patient not taking: Reported on 04/18/2024), Disp: , Rfl:    metroNIDAZOLE  (METROGEL ) 0.75 % vaginal gel, Place 1 Applicatorful vaginally at bedtime. Apply one applicatorful to vagina at bedtime for 5 days (Patient not taking: Reported on 04/18/2024), Disp: 70 g, Rfl: 1   Prenatal Vit-Fe Fumarate-FA (PRENATAL MULTIVITAMIN) TABS tablet, Take 1 tablet by mouth daily at 12 noon., Disp: , Rfl:   Current Facility-Administered Medications:    0.9 %  sodium chloride  infusion, 500 mL, Intravenous, Once, Nandigam, Kavitha V, MD  Observations/Objective: Patient is well-developed, well-nourished in no acute distress.  Resting comfortably Head is normocephalic, atraumatic.  No labored breathing.  Speech is clear and coherent with logical content.  Patient is alert and oriented at baseline.    Assessment and Plan: 1. Yeast infection (Primary) -  fluconazole  (DIFLUCAN ) 150 MG tablet; Take 1 tablet (150 mg total) by mouth every 3 (three) days as needed.  Dispense: 2 tablet; Refill: 0  - Symptoms consistent with yeast infection - Fluconazole  prescribed - Limit bubble baths, scented lotions/soaps/detergents - Limit tight fitting clothing - Seek on person evaluation if not improving or if symptoms worsen   Follow Up Instructions: I discussed the assessment and treatment plan with the patient. The patient was provided an opportunity to ask questions and all were answered. The patient agreed with the plan and demonstrated an understanding of the instructions.  A copy of instructions were sent to the patient via MyChart unless otherwise noted below.    The patient was advised to call back or seek an in-person evaluation if the symptoms worsen or if the condition fails to improve as anticipated.    Delon CHRISTELLA Dickinson, PA-C

## 2024-07-06 NOTE — Patient Instructions (Signed)
 Karen Hatfield, thank you for joining Delon CHRISTELLA Dickinson, PA-C for today's virtual visit.  While this provider is not your primary care provider (PCP), if your PCP is located in our provider database this encounter information will be shared with them immediately following your visit.   A [facility] MyChart account gives you access to today's visit and all your visits, tests, and labs performed at [facility]  click here if you don't have a [facility] MyChart account or go to [email or web address]  Consent: (Patient) Karen Hatfield provided verbal consent for this virtual visit at the beginning of the encounter.  Current Medications:  Current Outpatient Medications:    fluconazole  (DIFLUCAN ) 150 MG tablet, Take 1 tablet (150 mg total) by mouth every 3 (three) days as needed., Disp: 2 tablet, Rfl: 0   acetaminophen  (TYLENOL ) 500 MG tablet, Take 500-1,000 mg by mouth every 6 (six) hours as needed for moderate pain., Disp: , Rfl:    albuterol  (PROVENTIL  HFA;VENTOLIN  HFA) 108 (90 BASE) MCG/ACT inhaler, Inhale 2 puffs into the lungs every 6 (six) hours as needed for wheezing or shortness of breath. (Patient not taking: Reported on 04/14/2023), Disp: 1 Inhaler, Rfl: 1   AMBULATORY NON FORMULARY MEDICATION, Dr Elsa Gallbladder enzymes Take 1 tablet before a big meal, Disp: , Rfl:    beclomethasone (QVAR ) 40 MCG/ACT inhaler, Inhale 2 puffs into the lungs 2 (two) times daily at 10 AM and 5 PM. (Patient not taking: Reported on 10/24/2021), Disp: 1 Inhaler, Rfl: 1   calcium carbonate (TUMS EX) 750 MG chewable tablet, Chew 2 tablets by mouth as needed for heartburn. (Patient not taking: Reported on 04/18/2024), Disp: , Rfl:    diphenhydrAMINE  (BENADRYL  ALLERGY) 25 mg capsule, , Disp: , Rfl:    ferrous sulfate  325 (65 FE) MG tablet, Take 1 tablet (325 mg total) by mouth every other day. (Patient not taking: Reported on 03/09/2023), Disp: 30 tablet, Rfl: 1   lactase (LACTAID) 3000 units  tablet, Take 3,000 Units by mouth. Before eating dairy (Patient not taking: Reported on 04/18/2024), Disp: , Rfl:    levonorgestrel (MIRENA, 52 MG,) 20 MCG/DAY IUD, 1 each by Intrauterine route once. (Patient not taking: Reported on 04/18/2024), Disp: , Rfl:    Magnesium 250 MG TABS, Take 250 mg by mouth daily. (Patient not taking: Reported on 04/18/2024), Disp: , Rfl:    metroNIDAZOLE  (METROGEL ) 0.75 % vaginal gel, Place 1 Applicatorful vaginally at bedtime. Apply one applicatorful to vagina at bedtime for 5 days (Patient not taking: Reported on 04/18/2024), Disp: 70 g, Rfl: 1   Prenatal Vit-Fe Fumarate-FA (PRENATAL MULTIVITAMIN) TABS tablet, Take 1 tablet by mouth daily at 12 noon., Disp: , Rfl:   Current Facility-Administered Medications:    0.9 %  sodium chloride  infusion, 500 mL, Intravenous, Once, Nandigam, Kavitha V, MD   Medications ordered in this encounter:  Meds ordered this encounter  Medications   fluconazole  (DIFLUCAN ) 150 MG tablet    Sig: Take 1 tablet (150 mg total) by mouth every 3 (three) days as needed.    Dispense:  2 tablet    Refill:  0    Supervising Provider:   LAMPTEY, PHILIP O [[id number]]     *If you need refills on other medications prior to your next appointment, please contact your pharmacy*  Follow-Up: Call back or seek an in-person evaluation if the symptoms worsen or if the condition fails to improve as anticipated.  [facility] [phone]  Other Instructions Vaginal Yeast Infection,  Adult  Vaginal yeast infection is a condition that causes vaginal discharge as well as soreness, swelling, and redness (inflammation) of the vagina. This is a common condition. Some women get this infection frequently. What are the causes? This condition is caused by a change in the normal balance of the yeast (Candida) and normal bacteria that live in the vagina. This change causes an overgrowth of yeast, which causes the inflammation. What increases the  risk? The condition is more likely to develop in women who: Take antibiotic medicines. Have diabetes. Take birth control pills. Are pregnant. Douche often. Have a weak body defense system (immune system). Have been taking steroid medicines for a long time. Frequently wear tight clothing. What are the signs or symptoms? Symptoms of this condition include: White, thick, creamy vaginal discharge. Swelling, itching, redness, and irritation of the vagina. The lips of the vagina (labia) may be affected as well. Pain or a burning feeling while urinating. Pain during sex. How is this diagnosed? This condition is diagnosed based on: Your medical history. A physical exam. A pelvic exam. Your health care provider will examine a sample of your vaginal discharge under a microscope. Your health care provider may send this sample for testing to confirm the diagnosis. How is this treated? This condition is treated with medicine. Medicines may be over-the-counter or prescription. You may be told to use one or more of the following: Medicine that is taken by mouth (orally). Medicine that is applied as a cream (topically). Medicine that is inserted directly into the vagina (suppository). Follow these instructions at home: Take or apply over-the-counter and prescription medicines only as told by your health care provider. Do not use tampons until your health care provider approves. Do not have sex until your infection has cleared. Sex can prolong or worsen your symptoms of infection. Ask your health care provider when it is safe to resume sexual activity. Keep all follow-up visits. This is important. How is this prevented?  Do not wear tight clothes, such as pantyhose or tight pants. Wear breathable cotton underwear. Do not use douches, perfumed soap, creams, or powders. Wipe from front to back after using the toilet. If you have diabetes, keep your blood sugar levels under control. Ask your health  care provider for other ways to prevent yeast infections. Contact a health care provider if: You have a fever. Your symptoms go away and then return. Your symptoms do not get better with treatment. Your symptoms get worse. You have new symptoms. You develop blisters in or around your vagina. You have blood coming from your vagina and it is not your menstrual period. You develop pain in your abdomen. Summary Vaginal yeast infection is a condition that causes discharge as well as soreness, swelling, and redness (inflammation) of the vagina. This condition is treated with medicine. Medicines may be over-the-counter or prescription. Take or apply over-the-counter and prescription medicines only as told by your health care provider. Do not douche. Resume sexual activity or use of tampons as instructed by your health care provider. Contact a health care provider if your symptoms do not get better with treatment or your symptoms go away and then return. This information is not intended to replace advice given to you by your health care provider. Make sure you discuss any questions you have with your health care provider. Document Revised: 03/04/2021 Document Reviewed: 03/04/2021 Elsevier Patient Education  2024 [employer].   If you have been instructed to have an in-person evaluation today at  a local Urgent Care facility, please use the link below. It will take you to a list of all of our available [facility], including address, phone number and hours of operation. Please do not delay care.  [facility]  If you or a family member do not have a primary care provider, use the link below to schedule a visit and establish care. When you choose a [facility] primary care physician or advanced practice provider, you gain a long-term partner in health. Find a Primary Care Provider  Learn more about [facility]'s in-office and virtual care options:  - Get Care  Now

## 2024-09-16 ENCOUNTER — Other Ambulatory Visit: Payer: Self-pay | Admitting: Physician Assistant

## 2024-09-16 ENCOUNTER — Encounter: Payer: Self-pay | Admitting: Physician Assistant

## 2024-09-16 DIAGNOSIS — K8020 Calculus of gallbladder without cholecystitis without obstruction: Secondary | ICD-10-CM

## 2024-09-16 DIAGNOSIS — R109 Unspecified abdominal pain: Secondary | ICD-10-CM

## 2024-09-16 DIAGNOSIS — R1011 Right upper quadrant pain: Secondary | ICD-10-CM

## 2024-09-19 ENCOUNTER — Ambulatory Visit
Admission: RE | Admit: 2024-09-19 | Discharge: 2024-09-19 | Disposition: A | Discharge status: Home / Self Care | Source: Ambulatory Visit | Attending: Physician Assistant | Admitting: Physician Assistant

## 2024-09-19 ENCOUNTER — Other Ambulatory Visit: Payer: Self-pay | Admitting: Physician Assistant

## 2024-09-19 DIAGNOSIS — M549 Dorsalgia, unspecified: Secondary | ICD-10-CM

## 2024-09-19 DIAGNOSIS — R1011 Right upper quadrant pain: Secondary | ICD-10-CM

## 2024-09-19 DIAGNOSIS — K8020 Calculus of gallbladder without cholecystitis without obstruction: Secondary | ICD-10-CM

## 2024-09-19 DIAGNOSIS — G8929 Other chronic pain: Secondary | ICD-10-CM

## 2024-09-19 DIAGNOSIS — R109 Unspecified abdominal pain: Secondary | ICD-10-CM

## 2024-11-08 ENCOUNTER — Encounter: Payer: Self-pay | Admitting: Dietician

## 2024-11-08 ENCOUNTER — Encounter: Attending: Physician Assistant | Admitting: Dietician

## 2024-11-08 VITALS — Wt 212.0 lb

## 2024-11-08 DIAGNOSIS — R7303 Prediabetes: Secondary | ICD-10-CM | POA: Insufficient documentation

## 2024-11-08 NOTE — Progress Notes (Unsigned)
 Medical Nutrition Therapy  Appointment Start time:  [phone]  Appointment End time:  1740  Primary concerns today: abdominal pain   Referral diagnosis: prediabetes, unspecified abdominal pain Preferred learning style: no preference indicated Learning readiness: ready   NUTRITION ASSESSMENT   Anthropometrics   Wt 11/08/24: 212 lb  Clinical Medical Hx: anemia, asthma, prediabetes, Medications: reviewed Labs: reviewed: 03/18/24 A1c 5.7%  Notable Signs/Symptoms: abdominal pain in back area Food Allergies: none  Lifestyle & Dietary Hx  Pt present today with her husband Izzy.   Pt reports she had her gallbladder removed in November of 2022, and since then she will experience pain in her back a few hours after eating a high fat meal. Pt reports also an increase of gas during this time.   Pt reports she occasionally experiences hypoglycemia, and is something she has dealt with throughout her life. Pt reports recently her blood glucose dropped while at work.   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   Pt reports they typically try to do some meal prep on Sundays for 2-3 days of lunch or dinner, and otherwise will typically eat out. 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   Estimated daily fluid intake: 40 oz Supplements: occasional: MVI, magnesium, ferrous sulfate  Sleep: 11pm-6am Stress / self-care: did not assess Current average weekly physical activity: ADLs  24-Hr Dietary Recall First Meal: 7:30am: chocolate protein shake Snack: 9am: 7/11 croissant sandwich and redbull Second Meal: 12pm: packed: leftovers Snack: chips Third Meal: shepard's pie OR rice and chicken Snack: low fat ice cream Beverages: 1 red bull/monster, water, occasional soda/juice   NUTRITION DIAGNOSIS  NB-1.1 Food and nutrition-related knowledge deficit As related to lack of prior education by a registered dietitian.  As evidenced by pt report.   NUTRITION INTERVENTION  Nutrition  education (E-1) on the following topics:   Hypoglycemia Hypoglycemia is defined as a blood glucose level below about 70?mg/dL and can cause early adrenergic (fight-or-flight) symptoms like shakiness, sweating, anxiety, rapid heartbeat, hunger, and irritability, as well as neuroglycopenic effects such as confusion, difficulty concentrating, lightheadedness, blurred vision, slurred speech, coordination problems, seizures, or even loss of consciousness if not addressed promptly. Preventing such drops relies heavily on nutrition education: eating consistently, ideally across small, frequent meals every three to four hours, and combining carbohydrates with protein, healthy fats, and fiber (from sources like whole grains, lean proteins, fruits, and vegetables) helps maintain steady blood glucose and avoid sharp swings that heighten hypoglycemia risk. Balancing meal composition along with maintaining regular meal timing forms a cornerstone of effective hypoglycemia prevention.  Type of Fats: Choose Healthy Unsaturated Fats and Omega 3's. Sources: Olive oil, canola oil, avocados, nuts, and Fatty fish (salmon, trout), walnuts, flaxseeds, and chia seeds.  Limit Saturated Fats: Sources: Red meat, full-fat dairy products, butter, and coconut oil. Benefits: Reducing intake helps lower LDL cholesterol levels.  Increasing Fiber: Eat Plenty of Fruits and Vegetables. Benefits: High in fiber and antioxidants. Eat Whole Grains. Sources: Brown rice, whole wheat bread, quinoa, and whole grain pasta. Benefits: Whole grains contain more fiber and nutrients than refined grains. Soluble fiber: Sources: Oats, barley, beans, lentils, fruits (apples, oranges), vegetables (carrots, broccoli), and flaxseeds. Benefits: Soluble fiber reduces the absorption of cholesterol into your bloodstream.  Prediabetes: Prediabetes: Prediabetes is a condition where blood sugar levels are higher than normal but not yet high enough to be diagnosed  as type 2 diabetes. A1C, or hemoglobin A1c, is a blood test that provides  an average of a person's blood sugar levels over the past two to three months. It is commonly used to diagnose and monitor diabetes. For prediabetes, an A1C level between 5.7% and 6.4% typically is used to diagnose this. Here is how the A1C levels are generally categorized: Normal:  A1C below 5.7% Prediabetes:  A1C between 5.7% and 6.4% Diabetes:  A1C of 6.5% or higher When diagnosed with prediabetes, there are several lifestyle changes you can make to manage the condition: Healthy Eating:  Follow a well-balanced diet that includes a variety of fruits, vegetables, whole grains, lean proteins, and healthy fats. Monitor portion sizes and reduce intake of sugary and processed foods. Regular Physical Activity:  Engage in regular physical activity, such as brisk walking, cycling, or other aerobic exercises, for at least 150 minutes per week. Include strength training exercises at least twice a week. Weight Management: Achieve and maintain a healthy weight. Losing even a small amount of weight (3-5%) can significantly improve insulin sensitivity.  Handouts Provided Include  Plate Method  Learning Style & Readiness for Change Teaching method utilized: Visual & Auditory  Demonstrated degree of understanding via: Teach Back  Barriers to learning/adherence to lifestyle change: none  Goals Established by Pt  Goal 1: aim for 60 oz water daily.   Other Tips Discussed to Focus on:   When snacking, aim to include a complex carb and protein.  At meals, aim to include 1/2 plate non-starchy vegetables, 1/4 plate protein, and 1/4 plate complex carbs.   Aim to reduce fat when possible (baked/grilled meats instead of fried, english muffin instead of croissant, etc.)  MONITORING & EVALUATION Dietary intake, weekly physical activity, and follow up in 4-6 weeks.  Next Steps  Patient is to call for questions.

## 2024-11-16 ENCOUNTER — Ambulatory Visit
Admission: EM | Admit: 2024-11-16 | Discharge: 2024-11-16 | Disposition: A | Discharge status: Home / Self Care | Attending: Nurse Practitioner | Admitting: Nurse Practitioner

## 2024-11-16 ENCOUNTER — Other Ambulatory Visit: Payer: Self-pay

## 2024-11-16 DIAGNOSIS — M545 Low back pain, unspecified: Secondary | ICD-10-CM

## 2024-11-16 MED ORDER — METHOCARBAMOL 500 MG PO TABS: 500.0000 mg | ORAL_TABLET | Freq: Two times a day (BID) | ORAL | 0 refills | Status: DC | PRN

## 2024-11-16 MED ORDER — KETOROLAC TROMETHAMINE 30 MG/ML IJ SOLN
30.0000 mg | Freq: Once | INTRAMUSCULAR | Status: AC
  Administered 2024-11-16: 30 mg via INTRAMUSCULAR

## 2024-11-16 NOTE — ED Triage Notes (Signed)
 Pt c/o lower back pain and into right [id number]. Pt denies injury. Pt denies numbness or tingling. Pt denies urinary issues. Pt denies loss of bowel or bladder.

## 2024-11-16 NOTE — Discharge Instructions (Addendum)
 You were given a Toradol injection in clinic today. Do not take any over the counter NSAID's such as Advil , ibuprofen , Aleve, or naproxen for 24 hours.  You may take tylenol  if needed You may take Robaxin twice daily as needed.  This is a muscle relaxer and will make you drowsy.  Do not drink alcohol or drive while on this medication.  Heat to the back as needed and lots of rest.  Please follow-up with your PCP if your symptoms do not improve.  Please go to the ER for any worsening symptoms.  I hope you feel better soon!

## 2024-11-16 NOTE — ED Provider Notes (Signed)
 [facility]    CSN: [id number] Arrival date & time: 11/16/24  1756      History   Chief Complaint No chief complaint on file.   HPI Karen Hatfield is a 33 y.o. female presents for back pain.  Patient reports 2 weeks of a persistent waxing and waning bilateral lower back pain that radiates into her right hip.  Denies any known injury or inciting event.  No numbness tingling weakness of her lower extremities, no bowel or bladder incontinence, no saddle paresthesia.  No dysuria.  No history of back pain or surgeries.  She has been taking Tylenol  for symptoms but has not taken anything today.  No other concerns at this time  HPI  Past Medical History:  Diagnosis Date   Anemia    Asthma    Dyspnea    Fainting episodes    Frequent headaches    Gallstones    Migraines    Myalgia and myositis 10/30/2014    Patient Active Problem List   Diagnosis Date Noted   Pregnancy 09/11/2021   PROM (premature rupture of membranes) 09/10/2021   Cholecystitis 08/20/2021   Asthma exacerbation, mild 11/08/2014    Past Surgical History:  Procedure Laterality Date   CHOLECYSTECTOMY N/A 11/04/2021   Procedure: LAPAROSCOPIC CHOLECYSTECTOMY WITH INTRAOPERATIVE CHOLANGIOGRAM;  Surgeon: Curvin Deward MOULD, MD;  Location: [facility] OR;  Service: General;  Laterality: N/A;   WISDOM TOOTH EXTRACTION  2019    OB History     Gravida  1   Para  1   Term  1   Preterm      AB      Living  1      SAB      IAB      Ectopic      Multiple  0   Live Births  1            Home Medications    Prior to Admission medications   Medication Sig Start Date End Date Taking? Authorizing Provider  methocarbamol (ROBAXIN) 500 MG tablet Take 1 tablet (500 mg total) by mouth 2 (two) times daily as needed for muscle spasms. 11/16/24  Yes Durrell Barajas, Jodi R, NP  acetaminophen  (TYLENOL ) 500 MG tablet Take 500-1,000 mg by mouth every 6 (six) hours as needed for moderate pain.    [provider]   albuterol  (PROVENTIL  HFA;VENTOLIN  HFA) 108 (90 BASE) MCG/ACT inhaler Inhale 2 puffs into the lungs every 6 (six) hours as needed for wheezing or shortness of breath. Patient not taking: Reported on 04/14/2023 11/08/14   Saguier, [place], PA-C  AMBULATORY NON FORMULARY MEDICATION Dr Elsa Gallbladder enzymes Take 1 tablet before a big meal    [provider]  beclomethasone (QVAR ) 40 MCG/ACT inhaler Inhale 2 puffs into the lungs 2 (two) times daily at 10 AM and 5 PM. Patient not taking: Reported on 10/24/2021 11/08/14   Saguier, [place], PA-C  calcium carbonate (TUMS EX) 750 MG chewable tablet Chew 2 tablets by mouth as needed for heartburn. Patient not taking: Reported on 04/18/2024    [provider]  diphenhydrAMINE  (BENADRYL  ALLERGY) 25 mg capsule  04/06/23   [provider]  ferrous sulfate  325 (65 FE) MG tablet Take 1 tablet (325 mg total) by mouth every other day. Patient not taking: Reported on 03/09/2023 08/20/21 03/09/23  Nugent, Nat BRAVO, NP  fluconazole  (DIFLUCAN ) 150 MG tablet Take 1 tablet (150 mg total) by mouth every 3 (three) days as needed. 07/06/24  Vivienne Nest M, PA-C  lactase (LACTAID) 3000 units tablet Take 3,000 Units by mouth. Before eating dairy Patient not taking: Reported on 04/18/2024    [provider]  levonorgestrel (MIRENA, 52 MG,) 20 MCG/DAY IUD 1 each by Intrauterine route once. Patient not taking: Reported on 04/18/2024 11/15/21   [provider]  Magnesium 250 MG TABS Take 250 mg by mouth daily. Patient not taking: Reported on 04/18/2024    [provider]  metroNIDAZOLE  (METROGEL ) 0.75 % vaginal gel Place 1 Applicatorful vaginally at bedtime. Apply one applicatorful to vagina at bedtime for 5 days Patient not taking: Reported on 04/18/2024 06/24/23   Ajewole, Christana, MD  Prenatal Vit-Fe Fumarate-FA (PRENATAL MULTIVITAMIN) TABS tablet Take 1 tablet by mouth daily at 12 noon.    [provider]     Family History Family History  Problem Relation Age of Onset   Colon cancer Mother 27   Diabetes Mother    Asthma Sister        several on mom and dads   Asthma Brother        several on mom and dads   Stroke Maternal Grandmother    Hypertension Maternal Grandmother    Depression Maternal Grandmother    Dementia Maternal Grandmother    Vision loss Maternal Grandfather    Hypertension Maternal Grandfather    Heart disease Paternal Grandmother     Social History Social History   Tobacco Use   Smoking status: Never    Passive exposure: Never   Smokeless tobacco: Never  Vaping Use   Vaping status: Former   Substances: CBD   Devices: Only a couple times  Substance Use Topics   Alcohol use: Yes    Comment: occ   Drug use: Not Currently     Allergies   Pollen extract, Dust mite extract, and Miconazole   Review of Systems Review of Systems  Musculoskeletal:  Positive for back pain.     Physical Exam Triage Vital Signs ED Triage Vitals  Encounter Vitals Group     BP 11/16/24 1828 115/81     Girls Systolic BP Percentile --      Girls Diastolic BP Percentile --      Boys Systolic BP Percentile --      Boys Diastolic BP Percentile --      Pulse Rate 11/16/24 1828 72     Resp 11/16/24 1828 17     Temp 11/16/24 1828 98.4 F (36.9 C)     Temp Source 11/16/24 1828 Oral     SpO2 11/16/24 1828 97 %     Weight --      Height --      Head Circumference --      Peak Flow --      Pain Score 11/16/24 1826 9     Pain Loc --      Pain Education --      Exclude from Growth Chart --    No data found.  Updated Vital Signs BP 115/81   Pulse 72   Temp 98.4 F (36.9 C) (Oral)   Resp 17   SpO2 97%   Visual Acuity Right Eye Distance:   Left Eye Distance:   Bilateral Distance:    Right Eye Near:   Left Eye Near:    Bilateral Near:     Physical Exam Vitals and nursing note reviewed.  Constitutional:      General: She is not in acute distress.  Appearance: Normal appearance. She is not ill-appearing.  HENT:     Head: Normocephalic and atraumatic.  Eyes:     Pupils: Pupils are equal, round, and reactive to light.  Cardiovascular:     Rate and Rhythm: Normal rate.  Pulmonary:     Effort: Pulmonary effort is normal.  Musculoskeletal:     Lumbar back: Tenderness present. No swelling, edema, deformity, signs of trauma, lacerations, spasms or bony tenderness. Normal range of motion. Negative right straight leg raise test and negative left straight leg raise test. No scoliosis.       Back:     Comments: Strength 5 out of 5 bilateral lower extremities  Skin:    General: Skin is warm and dry.  Neurological:     General: No focal deficit present.     Mental Status: She is alert and oriented to person, place, and time.  Psychiatric:        Mood and Affect: Mood normal.        Behavior: Behavior normal.      UC Treatments / Results  Labs (all labs ordered are listed, but only abnormal results are displayed) Labs Reviewed - No data to display  EKG   Radiology No results found.  Procedures Procedures (including critical care time)  Medications Ordered in UC Medications  ketorolac  (TORADOL ) 30 MG/ML injection 30 mg (30 mg Intramuscular Given 11/16/24 1854)    Initial Impression / Assessment and Plan / UC Course  I have reviewed the triage vital signs and the nursing notes.  Pertinent labs & imaging results that were available during my care of the patient were reviewed by me and considered in my medical decision making (see chart for details).     Reviewed exam and symptoms with patient.  No red flags.  Discussed musculoskeletal cause of low back pain.  Patient given Toradol  injection in clinic.  Instructed no NSAIDs for 24 hours and verbalized understanding.  Will do trial of Robaxin , side effect profile reviewed.  Discussed heat rest and PCP follow-up if symptoms do not improve.  ER precautions reviewed. Final  Clinical Impressions(s) / UC Diagnoses   Final diagnoses:  Acute bilateral low back pain without sciatica     Discharge Instructions      You were given a Toradol  injection in clinic today. Do not take any over the counter NSAID's such as Advil , ibuprofen , Aleve, or naproxen for 24 hours.  You may take tylenol  if needed You may take Robaxin  twice daily as needed.  This is a muscle relaxer and will make you drowsy.  Do not drink alcohol or drive while on this medication.  Heat to the back as needed and lots of rest.  Please follow-up with your PCP if your symptoms do not improve.  Please go to the ER for any worsening symptoms.  I hope you feel better soon!      ED Prescriptions     Medication Sig Dispense Auth. Provider   methocarbamol  (ROBAXIN ) 500 MG tablet Take 1 tablet (500 mg total) by mouth 2 (two) times daily as needed for muscle spasms. 10 tablet Zilla Shartzer, Jodi R, NP      PDMP not reviewed this encounter.   Loreda Myla SAUNDERS, NP 11/16/24 1907

## 2024-11-25 ENCOUNTER — Encounter (HOSPITAL_COMMUNITY): Payer: Self-pay

## 2024-11-25 ENCOUNTER — Ambulatory Visit (HOSPITAL_COMMUNITY)
Admission: RE | Admit: 2024-11-25 | Discharge: 2024-11-25 | Disposition: A | Discharge status: Home / Self Care | Attending: Emergency Medicine | Admitting: Emergency Medicine

## 2024-11-25 VITALS — BP 129/72 | HR 96 | Temp 98.4°F | Resp 18 | Ht 64.0 in | Wt 215.0 lb

## 2024-11-25 DIAGNOSIS — B9689 Other specified bacterial agents as the cause of diseases classified elsewhere: Secondary | ICD-10-CM | POA: Diagnosis not present

## 2024-11-25 DIAGNOSIS — J4521 Mild intermittent asthma with (acute) exacerbation: Secondary | ICD-10-CM | POA: Diagnosis not present

## 2024-11-25 DIAGNOSIS — J019 Acute sinusitis, unspecified: Secondary | ICD-10-CM | POA: Diagnosis not present

## 2024-11-25 MED ORDER — CEFDINIR 300 MG PO CAPS: 300.0000 mg | ORAL_CAPSULE | Freq: Two times a day (BID) | ORAL | 0 refills | Status: AC

## 2024-11-25 MED ORDER — PREDNISONE 20 MG PO TABS: 40.0000 mg | ORAL_TABLET | Freq: Every day | ORAL | 0 refills | Status: AC

## 2024-11-25 NOTE — Discharge Instructions (Signed)
 Please use albuterol  inhaler 3 times daily (every 6 hours) for the next several days. Then continue as needed.  Prednisone  40 mg daily for the next 5 days  Start this tomorrow (Saturday morning)  Please take antibiotic OMNICEF  as prescribed. Take with food to avoid upset stomach. Finish the full course - you should not have any leftover. Return if symptoms not improving after 4 days on antibiotic

## 2024-11-25 NOTE — ED Provider Notes (Signed)
 [facility]    CSN: [id number] Arrival date & time: 11/25/24  1630      History   Chief Complaint Chief Complaint  Patient presents with   Cough   Appointment    4:30    HPI Karen Hatfield is a 33 y.o. female.  7 day history of worsening nasal congestion and cough Some shortness of breath after cough attacks Not feeling wheezing or chest tightness No fevers.  Denies abdominal pain, NVD, sore throat  Her daughter and husband have been sick with similar  Gradually worsening despite use of OTC options, Robitussin DayQuil and NyQuil Asthma history.  Has used inhaler once  Past Medical History:  Diagnosis Date   Anemia    Asthma    Dyspnea    Fainting episodes    Frequent headaches    Gallstones    Migraines    Myalgia and myositis 10/30/2014    Patient Active Problem List   Diagnosis Date Noted   Pregnancy 09/11/2021   PROM (premature rupture of membranes) 09/10/2021   Cholecystitis 08/20/2021   Asthma exacerbation, mild 11/08/2014    Past Surgical History:  Procedure Laterality Date   CHOLECYSTECTOMY N/A 11/04/2021   Procedure: LAPAROSCOPIC CHOLECYSTECTOMY WITH INTRAOPERATIVE CHOLANGIOGRAM;  Surgeon: Curvin Deward MOULD, MD;  Location: [facility] OR;  Service: General;  Laterality: N/A;   WISDOM TOOTH EXTRACTION  2019    OB History     Gravida  1   Para  1   Term  1   Preterm      AB      Living  1      SAB      IAB      Ectopic      Multiple  0   Live Births  1            Home Medications    Prior to Admission medications   Medication Sig Start Date End Date Taking? Authorizing Provider  albuterol  (PROVENTIL  HFA;VENTOLIN  HFA) 108 (90 BASE) MCG/ACT inhaler Inhale 2 puffs into the lungs every 6 (six) hours as needed for wheezing or shortness of breath. 11/08/14  Yes Saguier, [place], PA-C  cefdinir (OMNICEF) 300 MG capsule Take 1 capsule (300 mg total) by mouth 2 (two) times daily for 7 days. 11/25/24 12/02/24 Yes Mazen Marcin, [place],  PA-C  levonorgestrel (MIRENA, 52 MG,) 20 MCG/DAY IUD 1 each by Intrauterine route once. 11/15/21  Yes [provider]  predniSONE  (DELTASONE ) 20 MG tablet Take 2 tablets (40 mg total) by mouth daily with breakfast for 5 days. 11/26/24 12/01/24 Yes Jalaysia Lobb, [place], PA-C  acetaminophen  (TYLENOL ) 500 MG tablet Take 500-1,000 mg by mouth every 6 (six) hours as needed for moderate pain.    [provider]  AMBULATORY NON FORMULARY MEDICATION Dr Elsa Gallbladder enzymes Take 1 tablet before a big meal    [provider]  calcium carbonate (TUMS EX) 750 MG chewable tablet Chew 2 tablets by mouth as needed for heartburn. Patient not taking: Reported on 04/18/2024    [provider]  diphenhydrAMINE  (BENADRYL  ALLERGY) 25 mg capsule  04/06/23   [provider]  lactase (LACTAID) 3000 units tablet Take 3,000 Units by mouth. Before eating dairy Patient not taking: Reported on 04/18/2024    [provider]  Magnesium 250 MG TABS Take 250 mg by mouth daily. Patient not taking: Reported on 04/18/2024    [provider]  methocarbamol  (ROBAXIN ) 500 MG tablet Take 1 tablet (500 mg total) by  mouth 2 (two) times daily as needed for muscle spasms. 11/16/24   Mayer, Jodi R, NP  metroNIDAZOLE  (METROGEL ) 0.75 % vaginal gel Place 1 Applicatorful vaginally at bedtime. Apply one applicatorful to vagina at bedtime for 5 days Patient not taking: Reported on 04/18/2024 06/24/23   Ajewole, Christana, MD  Prenatal Vit-Fe Fumarate-FA (PRENATAL MULTIVITAMIN) TABS tablet Take 1 tablet by mouth daily at 12 noon.    [provider]    Family History Family History  Problem Relation Age of Onset   Colon cancer Mother 33   Diabetes Mother    Asthma Sister        several on mom and dads   Asthma Brother        several on mom and dads   Stroke Maternal Grandmother    Hypertension Maternal Grandmother    Depression Maternal Grandmother    Dementia Maternal  Grandmother    Vision loss Maternal Grandfather    Hypertension Maternal Grandfather    Heart disease Paternal Grandmother     Social History Social History   Tobacco Use   Smoking status: Never    Passive exposure: Never   Smokeless tobacco: Never  Vaping Use   Vaping status: Former   Substances: CBD   Devices: Only a couple times  Substance Use Topics   Alcohol use: Yes    Comment: occ   Drug use: Not Currently     Allergies   Pollen extract, Dust mite extract, and Miconazole   Review of Systems Review of Systems  Respiratory:  Positive for cough.    Per HPI  Physical Exam Triage Vital Signs ED Triage Vitals  Encounter Vitals Group     BP 11/25/24 1705 129/72     Girls Systolic BP Percentile --      Girls Diastolic BP Percentile --      Boys Systolic BP Percentile --      Boys Diastolic BP Percentile --      Pulse Rate 11/25/24 1705 96     Resp 11/25/24 1705 18     Temp 11/25/24 1705 98.4 F (36.9 C)     Temp Source 11/25/24 1705 Oral     SpO2 11/25/24 1705 95 %     Weight 11/25/24 1705 215 lb (97.5 kg)     Height 11/25/24 1705 5' 4 (1.626 m)     Head Circumference --      Peak Flow --      Pain Score 11/25/24 1703 3     Pain Loc --      Pain Education --      Exclude from Growth Chart --    No data found.  Updated Vital Signs BP 129/72 (BP Location: Right Wrist)   Pulse 96   Temp 98.4 F (36.9 C) (Oral)   Resp 18   Ht 5' 4 (1.626 m)   Wt 215 lb (97.5 kg)   LMP  (LMP Unknown)   SpO2 95%   BMI 36.90 kg/m    Physical Exam Vitals and nursing note reviewed.  Constitutional:      General: She is not in acute distress.    Appearance: Normal appearance. She is not ill-appearing or diaphoretic.  HENT:     Right Ear: Tympanic membrane and ear canal normal.     Left Ear: Tympanic membrane and ear canal normal.     Nose: Congestion present.     Mouth/Throat:     Pharynx: Oropharynx is clear.  Eyes:  Conjunctiva/sclera: Conjunctivae  normal.  Cardiovascular:     Rate and Rhythm: Normal rate and regular rhythm.     Pulses: Normal pulses.     Heart sounds: Normal heart sounds.  Pulmonary:     Effort: Pulmonary effort is normal. No respiratory distress.     Breath sounds: Normal breath sounds. No wheezing, rhonchi or rales.  Abdominal:     Palpations: Abdomen is soft.     Tenderness: There is no abdominal tenderness.  Musculoskeletal:     Cervical back: Normal range of motion. No rigidity.  Lymphadenopathy:     Cervical: No cervical adenopathy.  Neurological:     Mental Status: She is alert and oriented to person, place, and time.     UC Treatments / Results  Labs (all labs ordered are listed, but only abnormal results are displayed) Labs Reviewed - No data to display  EKG  Radiology No results found.  Procedures Procedures (including critical care time)  Medications Ordered in UC Medications - No data to display  Initial Impression / Assessment and Plan / UC Course  I have reviewed the triage vital signs and the nursing notes.  Pertinent labs & imaging results that were available during my care of the patient were reviewed by me and considered in my medical decision making (see chart for details).  Afebrile.  Well-appearing, clear lungs, satting 95% room air With gradually worsening symptoms over 7 days despite OTC options, treat for acute bacterial sinusitis with cefdinir twice daily for 7 days.  Also treat for asthma exacerbation with prednisone  burst and inhaler 3 times daily.  Advised reasons to return to clinic or be seen in the ED.  Patient is agreeable with this plan, no questions  Final Clinical Impressions(s) / UC Diagnoses   Final diagnoses:  Acute bacterial sinusitis  Mild intermittent asthma with exacerbation     Discharge Instructions      Please use albuterol  inhaler 3 times daily (every 6 hours) for the next several days. Then continue as needed.  Prednisone  40 mg daily for  the next 5 days  Start this tomorrow (Saturday morning)  Please take antibiotic OMNICEF as prescribed. Take with food to avoid upset stomach. Finish the full course - you should not have any leftover. Return if symptoms not improving after 4 days on antibiotic     ED Prescriptions     Medication Sig Dispense Auth. Provider   cefdinir (OMNICEF) 300 MG capsule Take 1 capsule (300 mg total) by mouth 2 (two) times daily for 7 days. 14 capsule Terrilyn Tyner, PA-C   predniSONE  (DELTASONE ) 20 MG tablet Take 2 tablets (40 mg total) by mouth daily with breakfast for 5 days. 10 tablet Kooper Godshall, [place], PA-C      PDMP not reviewed this encounter.   Jeryl [place]berry, [place] 11/25/24 [id number]

## 2024-11-25 NOTE — ED Triage Notes (Signed)
 Bad cough since last Friday and it's gotten worse - Entered by patient.  Patient's daughter was sick first with cough and fever. Patient has asthma as well. Having productive cough, SOB, and congestion.   Patient tried Robitussin and Dayquill /Nyquil pills  with little relief.

## 2024-12-20 ENCOUNTER — Encounter

## 2024-12-20 ENCOUNTER — Encounter: Payer: Self-pay | Admitting: Obstetrics and Gynecology

## 2024-12-23 ENCOUNTER — Other Ambulatory Visit: Payer: Self-pay | Admitting: Obstetrics and Gynecology

## 2024-12-23 DIAGNOSIS — N393 Stress incontinence (female) (male): Secondary | ICD-10-CM

## 2024-12-23 NOTE — Progress Notes (Signed)
 Reports hx of SUI - referral to PFPT placed.

## 2025-01-16 ENCOUNTER — Ambulatory Visit

## 2025-01-16 ENCOUNTER — Encounter: Payer: Self-pay | Admitting: Cardiovascular Disease

## 2025-01-16 ENCOUNTER — Ambulatory Visit: Attending: Cardiovascular Disease | Admitting: Cardiovascular Disease

## 2025-01-16 VITALS — BP 100/60 | HR 72 | Ht 64.0 in | Wt 212.2 lb

## 2025-01-16 DIAGNOSIS — R55 Syncope and collapse: Secondary | ICD-10-CM | POA: Insufficient documentation

## 2025-01-16 NOTE — Progress Notes (Signed)
 " Cardiology Office Note:    Date:  01/16/2025   ID:  Karen Hatfield, DOB [birth year], MRN [id number]  PCP:  System, Provider Not In   [facility] Providers Cardiologist:  None     Referring MD: Theo Eleanor PARAS, PA-C   Chief Complaint  Patient presents with   Loss of Consciousness    History of Present Illness:    Karen Hatfield is a 34 y.o. female presenting for evaluation of syncope.  The patient is here alone today.  She works at [employer].  She has a long history of syncope and near syncope.  Episodes started when she was young, in her teenage years.  She states they are definitely worse in the summertime.  She has prodromal symptoms of diaphoresis, nausea, abdominal discomfort, and sometimes tunnel vision.  She has had frank syncope on several occasions.  Most of her episodes, she is able to recognize the onset of symptoms and prevent syncope by getting herself down to the ground.  She has never had an episode while driving.  Episodes are generally while standing, although she had 1 syncopal episode on the toilet in the past.  It has been a few years since she has had frank syncope.  She denies exertional chest pain, chest pressure, or shortness of breath.  She has no family history of sudden cardiac death.  The patient has never had an echocardiogram or heart monitor in the past.  She has not had any extensive cardiac evaluation.  She states that she has not been focused on staying well-hydrated or eating frequent meals.  She has a busy life with a 80-year-old daughter and she works full-time.  Past Medical History:  Diagnosis Date   Anemia    Asthma    Dyspnea    Fainting episodes    Frequent headaches    Gallstones    Migraines    Myalgia and myositis 10/30/2014   Past Surgical History:  Procedure Laterality Date   CHOLECYSTECTOMY N/A 11/04/2021   Procedure: LAPAROSCOPIC CHOLECYSTECTOMY WITH INTRAOPERATIVE CHOLANGIOGRAM;  Surgeon: Curvin Deward MOULD, MD;   Location: [facility] OR;  Service: General;  Laterality: N/A;   WISDOM TOOTH EXTRACTION  2019    Current Medications: Active Medications[1]   Allergies:   Pollen extract, Dust mite extract, and Miconazole   ROS:   Please see the history of present illness.    All other systems reviewed and are negative.  EKGs/Labs/Other Studies Reviewed:    The following studies were reviewed today:     EKG:   EKG Interpretation Date/Time:  Monday January 16 2025 10:02:41 EST Ventricular Rate:  72 PR Interval:  136 QRS Duration:  84 QT Interval:  388 QTC Calculation: 424 R Axis:   46  Text Interpretation: Normal sinus rhythm Normal ECG No previous ECGs available Confirmed by Wonda Sharper [phone]) on 01/16/2025 10:17:47 AM    Recent Labs: No results found for requested labs within last 365 days.  Recent Lipid Panel No results found for: CHOL, TRIG, HDL, CHOLHDL, VLDL, LDLCALC, LDLDIRECT   Risk Assessment/Calculations:                Physical Exam:    VS:  BP 100/60 (BP Location: Left Arm)   Pulse 72   Ht 5' 4 (1.626 m)   Wt 212 lb 3.2 oz (96.3 kg)   SpO2 98%   BMI 36.42 kg/m     Wt Readings from Last 3 Encounters:  01/16/25 212 lb 3.2  oz (96.3 kg)  11/25/24 215 lb (97.5 kg)  11/08/24 212 lb (96.2 kg)     GEN:  Well nourished, well developed in no acute distress HEENT: Normal NECK: No JVD; No carotid bruits LYMPHATICS: No lymphadenopathy CARDIAC: RRR, no murmurs, rubs, gallops RESPIRATORY:  Clear to auscultation without rales, wheezing or rhonchi  ABDOMEN: Soft, non-tender, non-distended MUSCULOSKELETAL:  No edema; No deformity  SKIN: Warm and dry NEUROLOGIC:  Alert and oriented x 3 PSYCHIATRIC:  Normal affect   Assessment & Plan Syncope and collapse Symptoms sound highly typical for vasovagal syncope.  We discussed the mechanism of events and potential treatment options.  I have recommended an echocardiogram and a 14-day ZIO monitor to make sure that  there is no organic heart disease present.  Her EKG is within normal limits and is reassuring.  We discussed the importance of adequate fluid hydration, avoidance of prolonged standing especially in the heat, eating small frequent meals, and liberalizing sodium intake.  Will follow-up with her in 1 year unless her echo or ZIO monitor demonstrate significant abnormalities.  She will call back if symptoms progress in the interim.  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      Medication Adjustments/Labs and Tests Ordered: Current medicines are reviewed at length with the patient today.  Concerns regarding medicines are outlined above.  Orders Placed This Encounter  Procedures   LONG TERM MONITOR (3-14 DAYS)   EKG 12-Lead   ECHOCARDIOGRAM COMPLETE   No orders of the defined types were placed in this encounter.   Patient Instructions  Medication Instructions:  No medication changes were made at this visit. Continue current regimen.   *If you need a refill on your cardiac medications before your next appointment, please call your pharmacy*  Lab Work: None ordered today. If you have labs (blood work) drawn today and your tests are completely normal, you will receive your results only by: MyChart Message (if you have MyChart) OR A paper copy in the mail If you have any lab test that is abnormal or we need to change your treatment, we will call you to review the results.  Testing/Procedures: Your physician has requested that you wear a Zio heart monitor for 14 days. This will be mailed to your home with instructions on how to apply the monitor and how to return it when finished. Please allow 2 weeks after returning the heart monitor before our office calls you with the results.   Your physician has requested that you have an echocardiogram. Echocardiography is a painless test that uses sound waves to create images of your  heart. It provides your doctor with information about the size and shape of your heart and how well your hearts chambers and valves are working. This procedure takes approximately one hour. There are no restrictions for this procedure. Please do NOT wear cologne, perfume, aftershave, or lotions (deodorant is allowed). Please arrive 15 minutes prior to your appointment time.  Please note: We ask at that you not bring children with you during ultrasound (echo/ vascular) testing. Due to room size and safety concerns, children are not allowed in the ultrasound rooms during exams. Our front office staff cannot provide observation of children in our lobby area while testing is being conducted. An adult accompanying a patient to their appointment will only be allowed in the ultrasound room at the discretion of the ultrasound technician under special circumstances.  We apologize for any inconvenience.   Follow-Up: At [facility], you and your health needs are our priority.  As part of our continuing mission to provide you with exceptional heart care, our providers are all part of one team.  This team includes your primary Cardiologist (physician) and Advanced Practice Providers or APPs (Physician Assistants and Nurse Practitioners) who all work together to provide you with the care you need, when you need it.  Your next appointment:   1 year(s)  Provider:   Ozell Fell, MD    We recommend signing up for the patient portal called MyChart.  Sign up information is provided on this After Visit Summary.  MyChart is used to connect with patients for Virtual Visits (Telemedicine).  Patients are able to view lab/test results, encounter notes, upcoming appointments, etc.  Non-urgent messages can be sent to your provider as well.   To learn more about what you can do with MyChart, go to [email or web address].   Other Instructions ZIO XT- Long Term Monitor Instructions  Your physician has requested  you wear a ZIO patch monitor for 14 days.   This is a single patch monitor. [employer] supplies one patch monitor per enrollment. Additional  stickers are not available. Please do not apply patch if you will be having a Nuclear Stress Test,  Echocardiogram, Cardiac CT, MRI, or Chest Xray during the period you would be wearing the  monitor. The patch cannot be worn during these tests. You cannot remove and re-apply the  ZIO XT patch monitor.   Your ZIO patch monitor will be mailed 3 day USPS to your address on file. It may take 3-5 days  to receive your monitor after you have been enrolled.   Once you have received your monitor, please review the enclosed instructions. Your monitor  has already been registered assigning a specific monitor serial # to you.     Billing and Patient Assistance Program Information  We have supplied [employer] with any of your insurance information on file for billing purposes.  [employer] offers a sliding scale Patient Assistance Program for patients that do not have  insurance, or whose insurance does not completely cover the cost of the ZIO monitor.  You must apply for the Patient Assistance Program to qualify for this discounted rate.   To apply, please call [employer] at [phone], select option 4, select option 2, ask to apply for  Patient Assistance Program. Meredeth will ask your household income, and how many people  are in your household. They will quote your out-of-pocket cost based on that information.  [employer] will also be able to set up a 57-month, interest-free payment plan if needed.     Applying the monitor  Shave hair from upper left chest.  Hold abrader disc by orange tab. Rub abrader in 40 strokes over the upper left chest as  indicated in your monitor instructions.  Clean area with 4 enclosed alcohol pads. Let dry.  Apply patch as indicated in monitor instructions. Patch will be placed under collarbone on left  side of chest with arrow pointing  upward.  Rub patch adhesive wings for 2 minutes. Remove white label marked 1. Remove the white  label marked 2. Rub patch adhesive wings for 2 additional minutes.  While looking in a mirror, press and release button in center of patch. A small green light will  flash 3-4 times. This will be your only indicator that the monitor has been turned on.  Do not shower  for the first 24 hours. You may shower after the first 24 hours.  Press the button if you feel a symptom. You will hear a small click. Record Date, Time and  Symptom in the Patient Logbook.  When you are ready to remove the patch, follow instructions on the last 2 pages of Patient  Logbook. Stick patch monitor onto the last page of Patient Logbook.   Place Patient Logbook in the blue and white box. Use locking tab on box and tape box closed  securely. The blue and white box has prepaid postage on it. Please place it in the mailbox as  soon as possible. Your physician should have your test results approximately 7 days after the  monitor has been mailed back to [facility].   Call [facility] Customer Care at [phone] if you have questions regarding  your ZIO XT patch monitor. Call them immediately if you see an orange light blinking on your  monitor.   If your monitor falls off in less than 4 days, contact our Monitor department at [phone].   If your monitor becomes loose or falls off after 4 days call [employer] at [phone] for  suggestions on securing your monitor.      ----------------------------------------------------------------------------------------------------------------------------------  Vasovagal Syncope Episodes  ~Maintain adequate hydration ~Examples of exercises that may help prevent fainting from vasovagal syncope include: -Crossing your legs and squeezing them together -Tensing your arms with clenched fists -Tensing your leg muscles, abdomen, and buttocks -Squeezing a rubber  ball ~The best thing you can do to prevent fainting is to avoid your triggers--for example, excessive heat, stress, dehydration, pain, and prolonged standing.   Signed, Ozell Fell, MD  01/16/2025 2:00 PM    [facility]     [1]  Current Meds  Medication Sig   acetaminophen  (TYLENOL ) 500 MG tablet Take 500-1,000 mg by mouth every 6 (six) hours as needed for moderate pain.   albuterol  (PROVENTIL  HFA;VENTOLIN  HFA) 108 (90 BASE) MCG/ACT inhaler Inhale 2 puffs into the lungs every 6 (six) hours as needed for wheezing or shortness of breath.   AMBULATORY NON FORMULARY MEDICATION Dr Elsa Gallbladder enzymes Take 1 tablet before a big meal   calcium carbonate (TUMS EX) 750 MG chewable tablet Chew 2 tablets by mouth as needed for heartburn.   diphenhydrAMINE  (BENADRYL  ALLERGY) 25 mg capsule    lactase (LACTAID) 3000 units tablet Take 3,000 Units by mouth. Before eating dairy (Patient taking differently: Take 3,000 Units by mouth as needed (indigestion). Before eating dairy)   levonorgestrel (MIRENA, 52 MG,) 20 MCG/DAY IUD 1 each by Intrauterine route once.   Magnesium 250 MG TABS Take 250 mg by mouth daily.   Prenatal Vit-Fe Fumarate-FA (PRENATAL MULTIVITAMIN) TABS tablet Take 1 tablet by mouth daily at 12 noon.   Current Facility-Administered Medications for the 01/16/25 encounter (Office Visit) with Fell Ozell, MD  Medication   0.9 %  sodium chloride  infusion   "

## 2025-01-16 NOTE — Therapy (Unsigned)
 " OUTPATIENT PHYSICAL THERAPY FEMALE PELVIC EVALUATION   Patient Name: Karen Hatfield MRN: [id number] DOB:1991/03/12, 34 y.o., female Today's Date: 01/17/2025  END OF SESSION:  PT End of Session - 01/17/25 1143     Visit Number 1    Date for Recertification  07/17/25    Authorization Type [employer]    PT Start Time 1130    PT Stop Time 1215    PT Time Calculation (min) 45 min    Activity Tolerance Patient tolerated treatment well    Behavior During Therapy [facility] for tasks assessed/performed          Past Medical History:  Diagnosis Date   Anemia    Asthma    Dyspnea    Fainting episodes    Frequent headaches    Gallstones    Migraines    Myalgia and myositis 10/30/2014   Past Surgical History:  Procedure Laterality Date   CHOLECYSTECTOMY N/A 11/04/2021   Procedure: LAPAROSCOPIC CHOLECYSTECTOMY WITH INTRAOPERATIVE CHOLANGIOGRAM;  Surgeon: Curvin Deward MOULD, MD;  Location: [facility] OR;  Service: General;  Laterality: N/A;   WISDOM TOOTH EXTRACTION  2019   Patient Active Problem List   Diagnosis Date Noted   Pregnancy 09/11/2021   PROM (premature rupture of membranes) 09/10/2021   Cholecystitis 08/20/2021   Asthma exacerbation, mild 11/08/2014    PCP: None  REFERRING PROVIDER: Jeralyn Crutch, MD   REFERRING DIAG: N39.3 (ICD-10-CM) - SUI (stress urinary incontinence, female)   THERAPY DIAG:  Cramp and spasm - Plan: PT plan of care cert/re-cert  Other lack of coordination - Plan: PT plan of care cert/re-cert  Stress incontinence (female) (female) - Plan: PT plan of care cert/re-cert  Rationale for Evaluation and Treatment: Rehabilitation  ONSET DATE: 2022  SUBJECTIVE:                                                                                                                                                                                           SUBJECTIVE STATEMENT: SUI starts after her child in 2022 Fluid intake:   FUNCTIONAL LIMITATIONS: Coughing,  sneezing, laughing  PERTINENT HISTORY:  Medications for current condition: none Surgeries: Cholecystectomy Other: See above  PAIN:  Are you having pain? No   PRECAUTIONS: None  RED FLAGS: None   WEIGHT BEARING RESTRICTIONS: No  FALLS:  Has patient fallen in last 6 months? No  OCCUPATION: CMA  ACTIVITY LEVEL : low level  PLOF: Independent  PATIENT GOALS: stop urinary leakage   BOWEL MOVEMENT: no issues  URINATION: Pain with urination: No Fully empty bladder: Yes:  Post-void dribble: No Stream: Strong Urgency: No Frequency:during the day average                                                        Nocturia: No   Leakage: Coughing, Sneezing, and Laughing Pads/briefs: Yes: wears incontinence panties or pad  INTERCOURSE:  Ability to have vaginal penetration No  Pain with intercourse: none  PREGNANCY: Vaginal deliveries 1 Tearing Yes:   Currently pregnant   PROLAPSE: None   OBJECTIVE:  Note: Objective measures were completed at Evaluation unless otherwise noted.   PATIENT SURVEYS:  PFIQ-7: 28 UIQ-7 38  COGNITION: Overall cognitive status: Within functional limits for tasks assessed     SENSATION: Light touch: Appears intact   FUNCTIONAL TESTS:  Single leg stance:  Rt: no hip drop  Lt: no hip drop Sit-up test: Squat: reduce lumbar lordosis Bed mobility:  GAIT: Assistive device utilized: None  POSTURE: No Significant postural limitations   LUMBARAROM/PROM: lumbar ROM is full   LOWER EXTREMITY ROM:  Passive ROM Right eval Left eval  Hip flexion 108 115  Hip external rotation 35 45   (Blank rows = not tested)  LOWER EXTREMITY MMT:  MMT Right eval Left eval  Hip extension 4/5 4/5  Hip abduction 4/5 5/5   (Blank rows = not tested) PALPATION: Pelvic Alignment: ASIS are equal  Abdominal: tenderness located on the left lower quadrant  Diastasis: No Distortion: No  Breathing:  difficulty with opening the lower rib cage Scar tissue: No               External Perineal Exam: decreased movement of  perineal body                             Internal Pelvic Floor: tenderness along the levator ani, difficulty with relaxing the pelvic floor muscles after contraction  Patient confirms identification and approves PT to assess internal pelvic floor and treatment Yes All internal or external pelvic floor assessments and/or treatments are completed with proper hand hygiene and gloves hands. If needed gloves are changed with hand hygiene during patient care time. No emotional/communication barriers or cognitive limitation. Patient is motivated to learn. Patient understands and agrees with treatment goals and plan. PT explains patient will be examined in standing, sitting, and lying down to see how their muscles and joints work. When they are ready, they will be asked to remove their underwear so PT can examine their perineum. The patient is also given the option of providing their own chaperone as one is not provided in our facility. The patient also has the right and is explained the right to defer or refuse any part of the evaluation or treatment including the internal exam. With the patient's consent, PT will use one gloved finger to gently assess the muscles of the pelvic floor, seeing how well it contracts and relaxes and if there is muscle symmetry. After, the patient will get dressed and PT and patient will discuss exam findings and plan of care. PT and patient discuss plan of care, schedule, attendance policy and HEP activities.   PELVIC MMT:   MMT eval  Vaginal 3/5 3 sed  (Blank rows = not tested)        TONE: Increased tone  PROLAPSE:  none  TODAY'S TREATMENT:                                                                                                                              DATE: 01/17/25  EVAL Examination completed, findings reviewed, pt educated on POC, HEP,  and female pelvic floor anatomy, reasoning with pelvic floor assessment internally with pt consent, and abdominal massage. Pt motivated to participate in PT and agreeable to attempt recommendations.     PATIENT EDUCATION:  01/17/25 Education details: Access Code: [email or web address] Person educated: Patient Education method: Explanation, Demonstration, Tactile cues, Verbal cues, and Handouts Education comprehension: verbalized understanding, returned demonstration, verbal cues required, tactile cues required, and needs further education  HOME EXERCISE PROGRAM: 01/17/25 Access Code: [email or web address] URL: [email or web address] Date: 01/17/2025 Prepared by: Channing Pereyra  Exercises - Seated Diaphragmatic Breathing  - 2 x daily - 7 x weekly - 1 sets - 10 reps - Seated Piriformis Stretch with Trunk Bend  - 1 x daily - 7 x weekly - 1 sets - 2 reps - 30 sec hold - Seated Hamstring Stretch  - 1 x daily - 7 x weekly - 1 sets - 2 reps - 30 sec hold  For all possible CPT codes, reference the Planned Interventions line above.     Check all conditions that are expected to impact treatment: {Conditions expected to impact treatment:None of these apply   If treatment provided at initial evaluation, no treatment charged due to lack of authorization.       ASSESSMENT:  CLINICAL IMPRESSION: Patient is a 34 y.o. female who was seen today for physical therapy evaluation and treatment for stress incontinence.  Patient started urinary leakage with coughing, sneezing, and laughing after the birth of her daughter 2022. She wears incontinence pads during the day due to leakage. She has tenderness located in the left lower quadrant. She has difficulty with opening the lower rib cage. She has difficulty relaxing the pelvic floor after contraction. Pelvic floor strength is 3/5. She has decreased movement of the perineal body. She has decreased hip ROM and strength. Patient will benefit from skilled therapy to improve  tissue mobility and coordination to reduce urinary leakage with coughing, sneezing and laughing.   OBJECTIVE IMPAIRMENTS: decreased coordination, decreased strength, increased fascial restrictions, and increased muscle spasms.   ACTIVITY LIMITATIONS: continence  PARTICIPATION LIMITATIONS: community activity and occupation  PERSONAL FACTORS: Time since onset of injury/illness/exacerbation are also affecting patient's functional outcome.   REHAB POTENTIAL: Excellent  CLINICAL DECISION MAKING: Stable/uncomplicated  EVALUATION COMPLEXITY: Low   GOALS: Goals reviewed with patient? Yes  SHORT TERM GOALS: Target date: 02/14/25  Patient independent with initial HEP for core and pelvic floor flexibility and strength.  Baseline:not educated yet Goal status: INITIAL  2.  Patient educated on the [facility] to reduce leakage with coughing, sneezing and laughing.  Baseline: not educated yet Goal status: INITIAL  3.  Patient is able to perform diaphragmatic breathing to relax  the pelvic floor to contract through the full range.  Baseline: not able to perform yet Goal status: INITIAL   LONG TERM GOALS: Target date: 07/17/25  Patient is interdependent with advanced HEP for core and pelvic floor to reduce urinary leakage.  Baseline: not educated yet Goal status: INITIAL  2.  Patient is able to contract the pelvic floor quickly to not leak with laughing, coughing, and sneezing.  Baseline: leaks with activity Goal status: INITIAL  3.  Patient is able to not wear a pad during the day due to her urinary leakage is minimal to none.  Baseline: wears 1 pad per day.  Goal status: INITIAL  4.  UIQ-7 score is </- 10 due ot reduction of urinary leakage. 8 Baseline: UIQ-7 38 Goal status: INITIAL   PLAN:  PT FREQUENCY: 1x/week  PT DURATION: 6 months  PLANNED INTERVENTIONS: 97110-Therapeutic exercises, 97530- Therapeutic activity, 97112- Neuromuscular re-education, 97535- Self Care, [id number]-  Manual therapy, Patient/Family education, Joint mobilization, Spinal mobilization, and Biofeedback  PLAN FOR NEXT SESSION: diaphragmatic breathing, manual work to the pelvic floor, hip stretches, left lower abdominal work   Channing Pereyra, PT 01/17/25 12:58 PM  "

## 2025-01-16 NOTE — Patient Instructions (Addendum)
 Medication Instructions:  No medication changes were made at this visit. Continue current regimen.   *If you need a refill on your cardiac medications before your next appointment, please call your pharmacy*  Lab Work: None ordered today. If you have labs (blood work) drawn today and your tests are completely normal, you will receive your results only by: MyChart Message (if you have MyChart) OR A paper copy in the mail If you have any lab test that is abnormal or we need to change your treatment, we will call you to review the results.  Testing/Procedures: Your physician has requested that you wear a Zio heart monitor for 14 days. This will be mailed to your home with instructions on how to apply the monitor and how to return it when finished. Please allow 2 weeks after returning the heart monitor before our office calls you with the results.   Your physician has requested that you have an echocardiogram. Echocardiography is a painless test that uses sound waves to create images of your heart. It provides your doctor with information about the size and shape of your heart and how well your hearts chambers and valves are working. This procedure takes approximately one hour. There are no restrictions for this procedure. Please do NOT wear cologne, perfume, aftershave, or lotions (deodorant is allowed). Please arrive 15 minutes prior to your appointment time.  Please note: We ask at that you not bring children with you during ultrasound (echo/ vascular) testing. Due to room size and safety concerns, children are not allowed in the ultrasound rooms during exams. Our front office staff cannot provide observation of children in our lobby area while testing is being conducted. An adult accompanying a patient to their appointment will only be allowed in the ultrasound room at the discretion of the ultrasound technician under special circumstances. We apologize for any inconvenience.   Follow-Up: At  [facility], you and your health needs are our priority.  As part of our continuing mission to provide you with exceptional heart care, our providers are all part of one team.  This team includes your primary Cardiologist (physician) and Advanced Practice Providers or APPs (Physician Assistants and Nurse Practitioners) who all work together to provide you with the care you need, when you need it.  Your next appointment:   1 year(s)  Provider:   Ozell Fell, MD    We recommend signing up for the patient portal called MyChart.  Sign up information is provided on this After Visit Summary.  MyChart is used to connect with patients for Virtual Visits (Telemedicine).  Patients are able to view lab/test results, encounter notes, upcoming appointments, etc.  Non-urgent messages can be sent to your provider as well.   To learn more about what you can do with MyChart, go to [email or web address].   Other Instructions ZIO XT- Long Term Monitor Instructions  Your physician has requested you wear a ZIO patch monitor for 14 days.   This is a single patch monitor. [employer] supplies one patch monitor per enrollment. Additional  stickers are not available. Please do not apply patch if you will be having a Nuclear Stress Test,  Echocardiogram, Cardiac CT, MRI, or Chest Xray during the period you would be wearing the  monitor. The patch cannot be worn during these tests. You cannot remove and re-apply the  ZIO XT patch monitor.   Your ZIO patch monitor will be mailed 3 day USPS to your address on file. It may take 3-5  days  to receive your monitor after you have been enrolled.   Once you have received your monitor, please review the enclosed instructions. Your monitor  has already been registered assigning a specific monitor serial # to you.     Billing and Patient Assistance Program Information  We have supplied [employer] with any of your insurance information on file for billing purposes.   [employer] offers a sliding scale Patient Assistance Program for patients that do not have  insurance, or whose insurance does not completely cover the cost of the ZIO monitor.  You must apply for the Patient Assistance Program to qualify for this discounted rate.   To apply, please call [employer] at [phone], select option 4, select option 2, ask to apply for  Patient Assistance Program. Meredeth will ask your household income, and how many people  are in your household. They will quote your out-of-pocket cost based on that information.  [employer] will also be able to set up a 11-month, interest-free payment plan if needed.     Applying the monitor  Shave hair from upper left chest.  Hold abrader disc by orange tab. Rub abrader in 40 strokes over the upper left chest as  indicated in your monitor instructions.  Clean area with 4 enclosed alcohol pads. Let dry.  Apply patch as indicated in monitor instructions. Patch will be placed under collarbone on left  side of chest with arrow pointing upward.  Rub patch adhesive wings for 2 minutes. Remove white label marked 1. Remove the white  label marked 2. Rub patch adhesive wings for 2 additional minutes.  While looking in a mirror, press and release button in center of patch. A small green light will  flash 3-4 times. This will be your only indicator that the monitor has been turned on.  Do not shower for the first 24 hours. You may shower after the first 24 hours.  Press the button if you feel a symptom. You will hear a small click. Record Date, Time and  Symptom in the Patient Logbook.  When you are ready to remove the patch, follow instructions on the last 2 pages of Patient  Logbook. Stick patch monitor onto the last page of Patient Logbook.   Place Patient Logbook in the blue and white box. Use locking tab on box and tape box closed  securely. The blue and white box has prepaid postage on it. Please place it in the mailbox as  soon as  possible. Your physician should have your test results approximately 7 days after the  monitor has been mailed back to [facility].   Call [facility] Customer Care at [phone] if you have questions regarding  your ZIO XT patch monitor. Call them immediately if you see an orange light blinking on your  monitor.   If your monitor falls off in less than 4 days, contact our Monitor department at [phone].   If your monitor becomes loose or falls off after 4 days call [employer] at [phone] for  suggestions on securing your monitor.      ----------------------------------------------------------------------------------------------------------------------------------  Vasovagal Syncope Episodes  ~Maintain adequate hydration ~Examples of exercises that may help prevent fainting from vasovagal syncope include: -Crossing your legs and squeezing them together -Tensing your arms with clenched fists -Tensing your leg muscles, abdomen, and buttocks -Squeezing a rubber ball ~The best thing you can do to prevent fainting is to avoid your triggers--for example, excessive heat, stress, dehydration, pain, and prolonged standing.

## 2025-01-16 NOTE — Assessment & Plan Note (Addendum)
 Symptoms sound highly typical for vasovagal syncope.  We discussed the mechanism of events and potential treatment options.  I have recommended an echocardiogram and a 14-day ZIO monitor to make sure that there is no organic heart disease present.  Her EKG is within normal limits and is reassuring.  We discussed the importance of adequate fluid hydration, avoidance of prolonged standing especially in the heat, eating small frequent meals, and liberalizing sodium intake.  Will follow-up with her in 1 year unless her echo or ZIO monitor demonstrate significant abnormalities.  She will call back if symptoms progress in the interim.  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

## 2025-01-16 NOTE — Progress Notes (Unsigned)
 Enrolled patient for a 14 day Zio XT  monitor to be mailed to patients home

## 2025-01-17 ENCOUNTER — Encounter: Payer: Self-pay | Admitting: Physical Therapy

## 2025-01-17 ENCOUNTER — Encounter: Attending: Obstetrics and Gynecology | Admitting: Physical Therapy

## 2025-01-17 ENCOUNTER — Other Ambulatory Visit: Payer: Self-pay

## 2025-01-17 DIAGNOSIS — R278 Other lack of coordination: Secondary | ICD-10-CM | POA: Insufficient documentation

## 2025-01-17 DIAGNOSIS — N393 Stress incontinence (female) (male): Secondary | ICD-10-CM | POA: Diagnosis present

## 2025-01-17 DIAGNOSIS — R252 Cramp and spasm: Secondary | ICD-10-CM | POA: Insufficient documentation

## 2025-01-26 ENCOUNTER — Encounter: Payer: Self-pay | Admitting: Physical Therapy

## 2025-01-26 DIAGNOSIS — N393 Stress incontinence (female) (male): Secondary | ICD-10-CM

## 2025-01-26 DIAGNOSIS — R252 Cramp and spasm: Secondary | ICD-10-CM

## 2025-01-26 DIAGNOSIS — R278 Other lack of coordination: Secondary | ICD-10-CM

## 2025-01-26 NOTE — Therapy (Signed)
 " OUTPATIENT PHYSICAL THERAPY FEMALE PELVIC TREATMENT   Patient Name: Karen Hatfield MRN: [id number] DOB:09-16-1991, 34 y.o., female Today's Date: 01/26/2025  END OF SESSION:  PT End of Session - 01/26/25 1600     Visit Number 2    Date for Recertification  07/17/25    Authorization Type [employer]    Authorization Time Period 1/20-2/18    Authorization - Visit Number 1    Authorization - Number of Visits 4    PT Start Time 1600    PT Stop Time 1645    PT Time Calculation (min) 45 min    Activity Tolerance Patient tolerated treatment well    Behavior During Therapy [facility] for tasks assessed/performed          Past Medical History:  Diagnosis Date   Anemia    Asthma    Dyspnea    Fainting episodes    Frequent headaches    Gallstones    Migraines    Myalgia and myositis 10/30/2014   Past Surgical History:  Procedure Laterality Date   CHOLECYSTECTOMY N/A 11/04/2021   Procedure: LAPAROSCOPIC CHOLECYSTECTOMY WITH INTRAOPERATIVE CHOLANGIOGRAM;  Surgeon: Curvin Deward MOULD, MD;  Location: [facility] OR;  Service: General;  Laterality: N/A;   WISDOM TOOTH EXTRACTION  2019   Patient Active Problem List   Diagnosis Date Noted   Pregnancy 09/11/2021   PROM (premature rupture of membranes) 09/10/2021   Cholecystitis 08/20/2021   Asthma exacerbation, mild 11/08/2014    PCP: None  REFERRING PROVIDER: Jeralyn Crutch, MD   REFERRING DIAG: N39.3 (ICD-10-CM) - SUI (stress urinary incontinence, female)   THERAPY DIAG:  Cramp and spasm  Other lack of coordination  Stress incontinence (female) (female)  Rationale for Evaluation and Treatment: Rehabilitation  ONSET DATE: 2022  SUBJECTIVE:                                                                                                                                                                                           SUBJECTIVE STATEMENT: I do better with the breathing exercises. I am on my cycle.   FUNCTIONAL LIMITATIONS:  Coughing, sneezing, laughing  PERTINENT HISTORY:  Medications for current condition: none Surgeries: Cholecystectomy Other: See above  PAIN:  Are you having pain? No   PRECAUTIONS: None  RED FLAGS: None   WEIGHT BEARING RESTRICTIONS: No  FALLS:  Has patient fallen in last 6 months? No  OCCUPATION: CMA  ACTIVITY LEVEL : low level  PLOF: Independent  PATIENT GOALS: stop urinary leakage   BOWEL MOVEMENT: no issues  URINATION: Pain with urination: No Fully empty bladder: Yes:  Post-void dribble: No Stream: Strong Urgency: No Frequency:during the day average                                                        Nocturia: No   Leakage: Coughing, Sneezing, and Laughing Pads/briefs: Yes: wears incontinence panties or pad  INTERCOURSE:  Ability to have vaginal penetration No  Pain with intercourse: none  PREGNANCY: Vaginal deliveries 1 Tearing Yes:   Currently pregnant   PROLAPSE: None   OBJECTIVE:  Note: Objective measures were completed at Evaluation unless otherwise noted.   PATIENT SURVEYS:  PFIQ-7: 28 UIQ-7 38  COGNITION: Overall cognitive status: Within functional limits for tasks assessed     SENSATION: Light touch: Appears intact   FUNCTIONAL TESTS:  Single leg stance:  Rt: no hip drop  Lt: no hip drop Sit-up test: Squat: reduce lumbar lordosis Bed mobility:  GAIT: Assistive device utilized: None  POSTURE: No Significant postural limitations   LUMBARAROM/PROM: lumbar ROM is full   LOWER EXTREMITY ROM:  Passive ROM Right eval Left eval  Hip flexion 108 115  Hip external rotation 35 45   (Blank rows = not tested)  LOWER EXTREMITY MMT:  MMT Right eval Left eval  Hip extension 4/5 4/5  Hip abduction 4/5 5/5   (Blank rows = not tested) PALPATION: Pelvic Alignment: ASIS are equal  Abdominal: tenderness located on the left lower quadrant  Diastasis: No Distortion: No   Breathing: difficulty with opening the lower rib cage Scar tissue: No               External Perineal Exam: decreased movement of  perineal body                             Internal Pelvic Floor: tenderness along the levator ani, difficulty with relaxing the pelvic floor muscles after contraction  Patient confirms identification and approves PT to assess internal pelvic floor and treatment Yes All internal or external pelvic floor assessments and/or treatments are completed with proper hand hygiene and gloves hands. If needed gloves are changed with hand hygiene during patient care time. No emotional/communication barriers or cognitive limitation. Patient is motivated to learn. Patient understands and agrees with treatment goals and plan. PT explains patient will be examined in standing, sitting, and lying down to see how their muscles and joints work. When they are ready, they will be asked to remove their underwear so PT can examine their perineum. The patient is also given the option of providing their own chaperone as one is not provided in our facility. The patient also has the right and is explained the right to defer or refuse any part of the evaluation or treatment including the internal exam. With the patient's consent, PT will use one gloved finger to gently assess the muscles of the pelvic floor, seeing how well it contracts and relaxes and if there is muscle symmetry. After, the patient will get dressed and PT and patient will discuss exam findings and plan of care. PT and patient discuss plan of care, schedule, attendance policy and HEP activities.   PELVIC MMT:   MMT eval  Vaginal 3/5 3 sed  (Blank rows = not tested)        TONE: Increased tone  PROLAPSE:  none  TODAY'S TREATMENT:   01/26/25 Manual: Myofascial release: Fascial release of the lower abdomen to reduce restrictions Neuromuscular re-education: Core facilitation: Transverse abdominus contraction 10 x Supine  marching with core engaged 10 x  Supine with yoga block pressed between hands and move up and down with core engaged 2 x 10 Bridge with yoga block pressed between hands and bridge with moving the block up and down 2 x 10 Standing anti rotation 20 x each way Exercises: Stretches/mobility: Sitting piriformis stretch holding 30 sec each  Sitting hamstring stretch holding 30 sec each Childs pose with diaphragmatic breathing Thread the needle holding 15 sec each Cat cow 15 times                                                                                                                               DATE: 01/17/25  EVAL Examination completed, findings reviewed, pt educated on POC, HEP, and female pelvic floor anatomy, reasoning with pelvic floor assessment internally with pt consent, and abdominal massage. Pt motivated to participate in PT and agreeable to attempt recommendations.     PATIENT EDUCATION:  01/26/25 Education details: Access Code: [email or web address] Person educated: Patient Education method: Explanation, Demonstration, Tactile cues, Verbal cues, and Handouts Education comprehension: verbalized understanding, returned demonstration, verbal cues required, tactile cues required, and needs further education  HOME EXERCISE PROGRAM: 01/26/25 Access Code: [email or web address] URL: [email or web address] Date: 01/26/2025 Prepared by: Channing Pereyra  Exercises - Seated Diaphragmatic Breathing  - 2 x daily - 7 x weekly - 1 sets - 10 reps - Seated Piriformis Stretch with Trunk Bend  - 1 x daily - 7 x weekly - 1 sets - 2 reps - 30 sec hold - Seated Hamstring Stretch  - 1 x daily - 7 x weekly - 1 sets - 2 reps - 30 sec hold - Diaphragmatic Breathing in Child's Pose with Pelvic Floor Relaxation  - 1 x daily - 3 x weekly - 1 sets - 2 reps - Child's Pose with Thread the Needle  - 1 x daily - 3 x weekly - 1 sets - 2 reps - 15 sec  hold - Hooklying Transversus Abdominis Palpation  - 1 x daily - 3 x  weekly - 1 sets - 10 reps - Supine Bridge  - 1 x daily - 3 x weekly - 2 sets - 10 reps - Supine March with Posterior Pelvic Tilt  - 1 x daily - 3 x weekly - 2 sets - 10 reps - Standing Anti-Rotation Press with Anchored Resistance  - 1 x daily - 3 x weekly - 1 sets - 10 reps    ASSESSMENT:  CLINICAL IMPRESSION: Patient is a 34 y.o. female who was seen today for physical therapy treatment for stress incontinence.  Patient is learning exercises to engage her core and pelvic floor for strengthening. She is able to engage her transverse abdominus. She needs cues to breath to assist with contraction of  core and pelvic floor. Patient has tightness in her back and hips.  She is able to perform her diaphragmatic breathing to relax her pelvic floor. Patient will benefit from skilled therapy to improve tissue mobility and coordination to reduce urinary leakage with coughing, sneezing and laughing.   OBJECTIVE IMPAIRMENTS: decreased coordination, decreased strength, increased fascial restrictions, and increased muscle spasms.   ACTIVITY LIMITATIONS: continence  PARTICIPATION LIMITATIONS: community activity and occupation  PERSONAL FACTORS: Time since onset of injury/illness/exacerbation are also affecting patient's functional outcome.   REHAB POTENTIAL: Excellent  CLINICAL DECISION MAKING: Stable/uncomplicated  EVALUATION COMPLEXITY: Low   GOALS: Goals reviewed with patient? Yes  SHORT TERM GOALS: Target date: 02/14/25  Patient independent with initial HEP for core and pelvic floor flexibility and strength.  Baseline:not educated yet Goal status: Met 01/26/25  2.  Patient educated on the [facility] to reduce leakage with coughing, sneezing and laughing.  Baseline: not educated yet Goal status: INITIAL  3.  Patient is able to perform diaphragmatic breathing to relax the pelvic floor to contract through the full range.  Baseline: not able to perform yet Goal status: Met 01/26/25   LONG TERM  GOALS: Target date: 07/17/25  Patient is interdependent with advanced HEP for core and pelvic floor to reduce urinary leakage.  Baseline: not educated yet Goal status: INITIAL  2.  Patient is able to contract the pelvic floor quickly to not leak with laughing, coughing, and sneezing.  Baseline: leaks with activity Goal status: INITIAL  3.  Patient is able to not wear a pad during the day due to her urinary leakage is minimal to none.  Baseline: wears 1 pad per day.  Goal status: INITIAL  4.  UIQ-7 score is </- 10 due ot reduction of urinary leakage. 8 Baseline: UIQ-7 38 Goal status: INITIAL   PLAN:  PT FREQUENCY: 1x/week  PT DURATION: 6 months  PLANNED INTERVENTIONS: 97110-Therapeutic exercises, 97530- Therapeutic activity, 97112- Neuromuscular re-education, 97535- Self Care, [id number]- Manual therapy, Patient/Family education, Joint mobilization, Spinal mobilization, and Biofeedback  PLAN FOR NEXT SESSION: manual work to the pelvic floor, hip terisa TATIANA Channing Elnor, PT 01/26/25 4:43 PM  "

## 2025-02-07 ENCOUNTER — Encounter: Payer: Self-pay | Admitting: Physical Therapy

## 2025-02-20 ENCOUNTER — Ambulatory Visit (HOSPITAL_COMMUNITY)

## 2025-02-28 ENCOUNTER — Encounter: Admitting: Physical Therapy

## 2025-03-07 ENCOUNTER — Encounter: Admitting: Physical Therapy

## 2025-03-14 ENCOUNTER — Encounter: Admitting: Physical Therapy
